# Patient Record
Sex: Male | Born: 1970 | Race: White | Hispanic: No | Marital: Married | Smoking: Current every day smoker
Health system: Southern US, Community
[De-identification: ages and names within clinical notes are randomized; demographics above are authoritative.]

## PROBLEM LIST (undated history)

## (undated) DIAGNOSIS — E7800 Pure hypercholesterolemia, unspecified: Secondary | ICD-10-CM

---

## 2018-07-17 ENCOUNTER — Other Ambulatory Visit: Payer: Self-pay | Admitting: Family Medicine

## 2018-07-17 DIAGNOSIS — R748 Abnormal levels of other serum enzymes: Secondary | ICD-10-CM

## 2018-07-25 ENCOUNTER — Other Ambulatory Visit: Payer: Self-pay

## 2018-07-26 ENCOUNTER — Ambulatory Visit
Admission: RE | Admit: 2018-07-26 | Discharge: 2018-07-26 | Disposition: A | Discharge status: Home / Self Care | Payer: 59 | Source: Ambulatory Visit | Attending: Family Medicine | Admitting: Family Medicine

## 2018-07-26 DIAGNOSIS — R748 Abnormal levels of other serum enzymes: Secondary | ICD-10-CM

## 2018-07-27 ENCOUNTER — Encounter (HOSPITAL_COMMUNITY): Payer: Self-pay | Admitting: Emergency Medicine

## 2018-07-27 ENCOUNTER — Emergency Department (HOSPITAL_COMMUNITY)
Admission: EM | Admit: 2018-07-27 | Discharge: 2018-07-27 | Disposition: A | Discharge status: Home / Self Care | Payer: 59 | Attending: Emergency Medicine | Admitting: Emergency Medicine

## 2018-07-27 ENCOUNTER — Other Ambulatory Visit: Payer: Self-pay

## 2018-07-27 DIAGNOSIS — Y9259 Other trade areas as the place of occurrence of the external cause: Secondary | ICD-10-CM | POA: Insufficient documentation

## 2018-07-27 DIAGNOSIS — Y998 Other external cause status: Secondary | ICD-10-CM | POA: Diagnosis not present

## 2018-07-27 DIAGNOSIS — S61215A Laceration without foreign body of left ring finger without damage to nail, initial encounter: Secondary | ICD-10-CM

## 2018-07-27 DIAGNOSIS — Y9379 Activity, other specified sports and athletics: Secondary | ICD-10-CM | POA: Insufficient documentation

## 2018-07-27 DIAGNOSIS — Z23 Encounter for immunization: Secondary | ICD-10-CM | POA: Diagnosis not present

## 2018-07-27 DIAGNOSIS — F17210 Nicotine dependence, cigarettes, uncomplicated: Secondary | ICD-10-CM | POA: Insufficient documentation

## 2018-07-27 DIAGNOSIS — W321XXA Accidental handgun malfunction, initial encounter: Secondary | ICD-10-CM | POA: Diagnosis not present

## 2018-07-27 HISTORY — DX: Pure hypercholesterolemia, unspecified: E78.00

## 2018-07-27 MED ORDER — TETANUS-DIPHTH-ACELL PERTUSSIS 5-2.5-18.5 LF-MCG/0.5 IM SUSP
0.5000 mL | Freq: Once | INTRAMUSCULAR | Status: AC
  Administered 2018-07-27: 0.5 mL via INTRAMUSCULAR
  Filled 2018-07-27: qty 0.5
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
## 2018-07-27 NOTE — Discharge Instructions (Addendum)
Your wound was repaired with Steri-Strips.  These will come off in about 7 days.  Please allow them to come off on their own.  Your tetanus status was updated today.  Please enter this information in your medical records.  Please see your primary physician or return to the emergency department if any signs of infection.
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
## 2021-01-14 ENCOUNTER — Other Ambulatory Visit: Payer: Self-pay | Admitting: Family Medicine

## 2021-01-14 ENCOUNTER — Other Ambulatory Visit: Payer: Self-pay

## 2021-01-14 ENCOUNTER — Ambulatory Visit
Admission: RE | Admit: 2021-01-14 | Discharge: 2021-01-14 | Disposition: A | Discharge status: Home / Self Care | Payer: 59 | Source: Ambulatory Visit | Attending: Family Medicine | Admitting: Family Medicine

## 2021-01-14 DIAGNOSIS — R1032 Left lower quadrant pain: Secondary | ICD-10-CM

## 2021-01-14 MED ORDER — IOPAMIDOL (ISOVUE-300) INJECTION 61%
100.0000 mL | Freq: Once | INTRAVENOUS | Status: AC | PRN
  Administered 2021-01-14: 100 mL via INTRAVENOUS

## 2021-09-27 ENCOUNTER — Ambulatory Visit: Admit: 2021-09-27 | Payer: 59 | Admitting: General Surgery

## 2021-09-27 SURGERY — REPAIR, HERNIA, INGUINAL, LAPAROSCOPIC
Anesthesia: General

## 2021-12-20 IMAGING — CT CT ABD-PELV W/ CM
2 of 5 series · 14 of 46 positions shown, 16 images · IV contrast (iopamidol)
Comparison: Ultrasound 07/26/2018

CLINICAL DATA: Left lower quadrant abdominal pain. Previous history
of diverticulitis.

EXAM:
CT ABDOMEN AND PELVIS WITH CONTRAST
TECHNIQUE: Multidetector CT imaging of the abdomen and pelvis was performed
using the standard protocol following bolus administration of
intravenous contrast.
CONTRAST:  100mL UB9FYE-SOO IOPAMIDOL (UB9FYE-SOO) INJECTION 61%

[Series 2: abd pelvis 5.00 br40 s3 axial · axial · 0.67mm/px · z∈[+1331,+1746]mm · 11 of 95 slices shown, 13 images]
[im 6/95  soft-tissue]
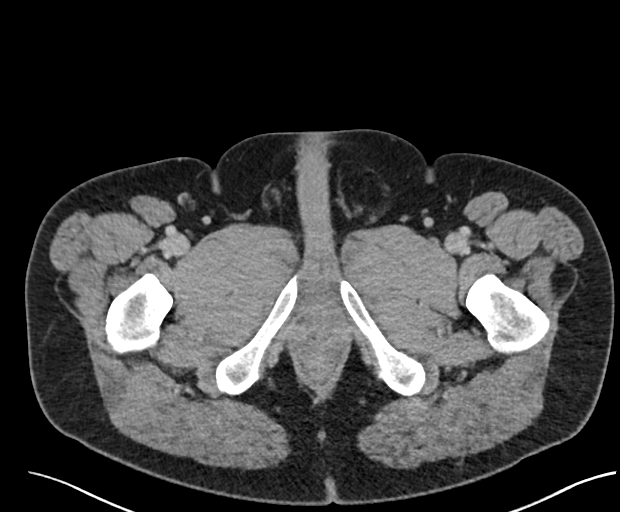
[im 6/95  bone]
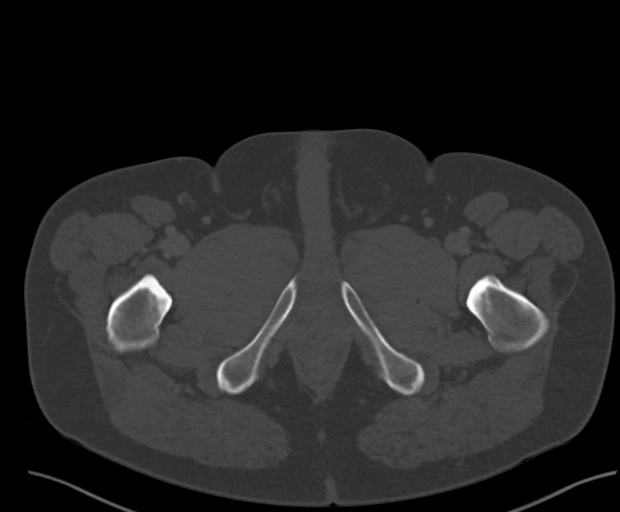
[im 16/95  soft-tissue]
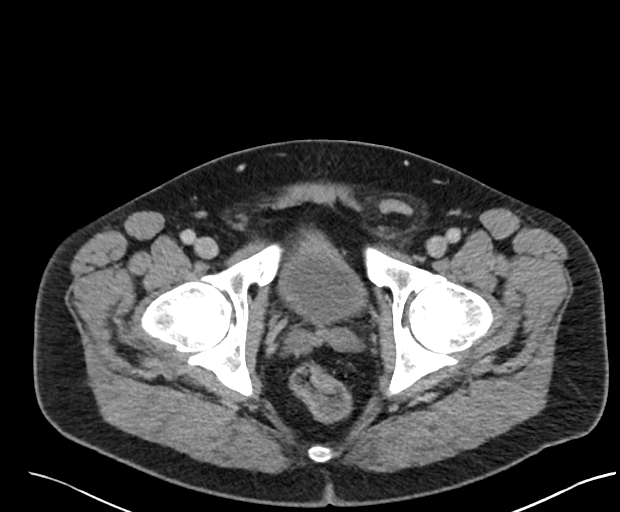
[im 21/95  soft-tissue]
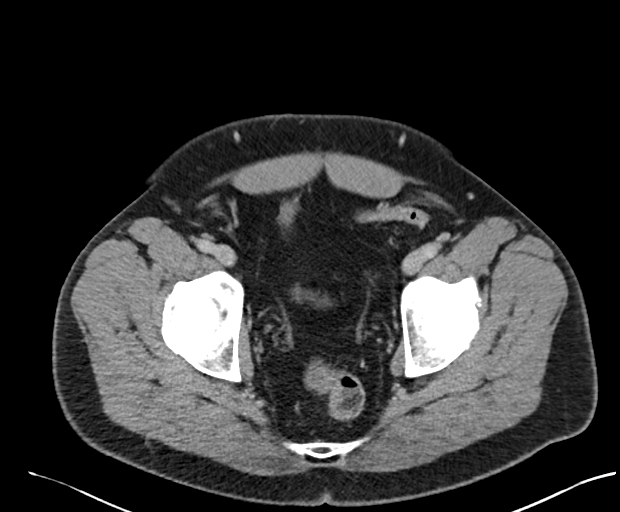
[im 32/95  soft-tissue]
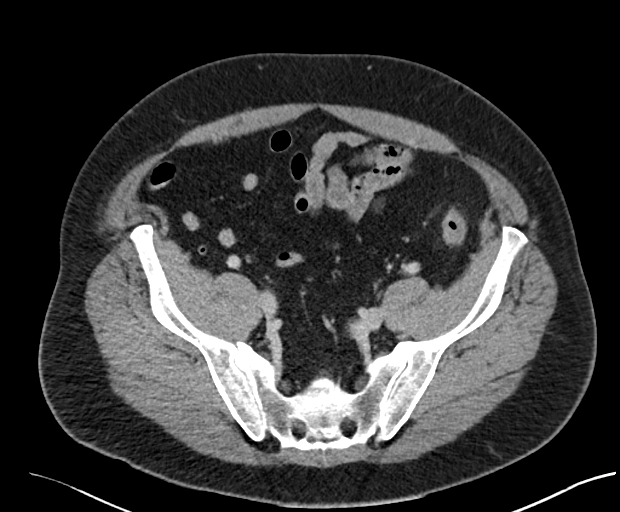
[im 37/95  soft-tissue]
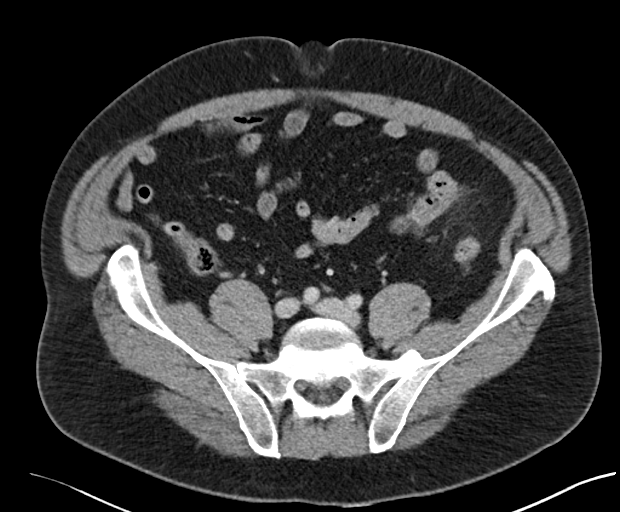
[im 48/95  soft-tissue]
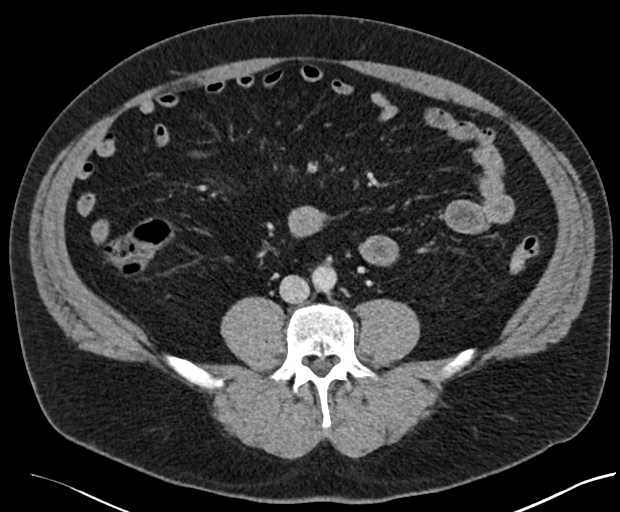
[im 58/95  soft-tissue]
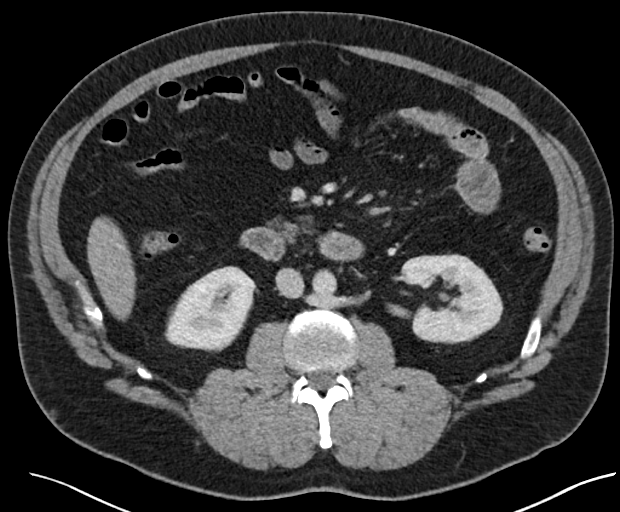
[im 63/95  soft-tissue]
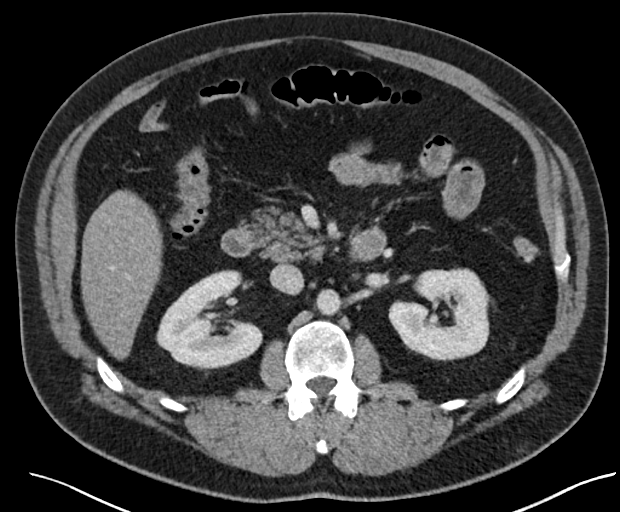
[im 74/95  soft-tissue]
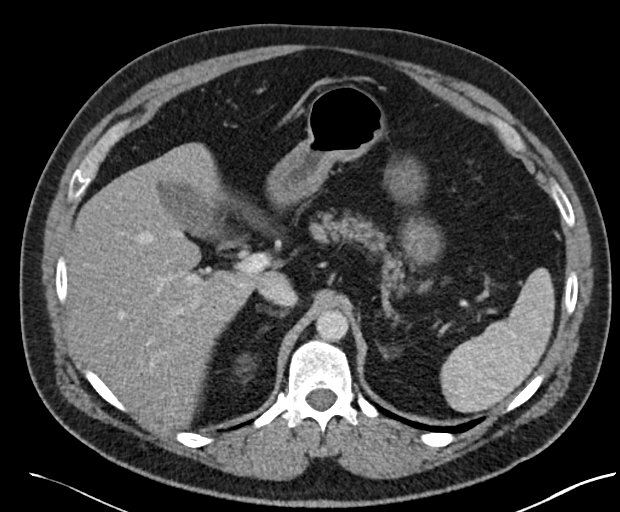
[im 74/95  bone]
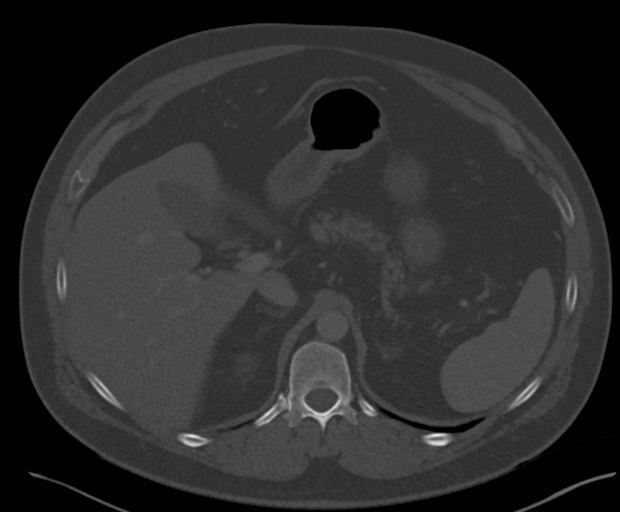
[im 79/95  soft-tissue]
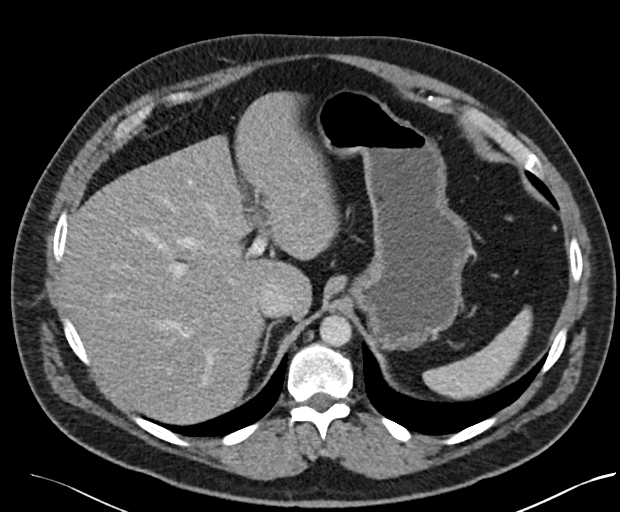
[im 89/95  soft-tissue]
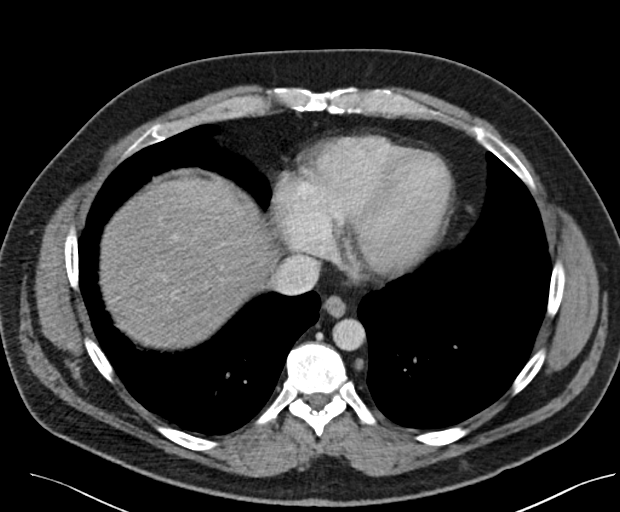

[Series 6: abd pelvis 2.00 br40 s3 cor · coronal · 0.82mm/px · 3 of 172 slices shown]
[im 58/172  soft-tissue]
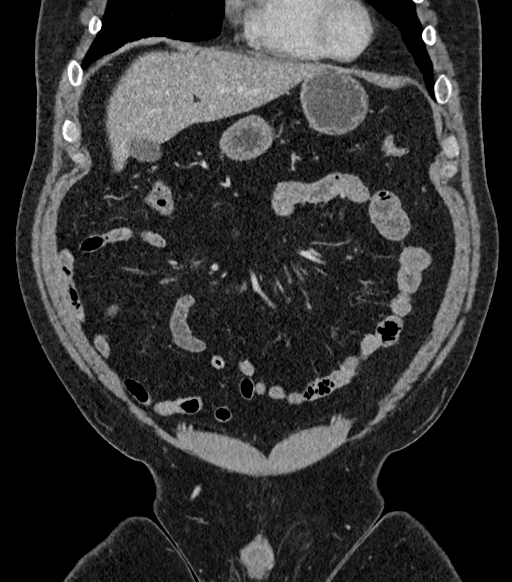
[im 77/172  soft-tissue]
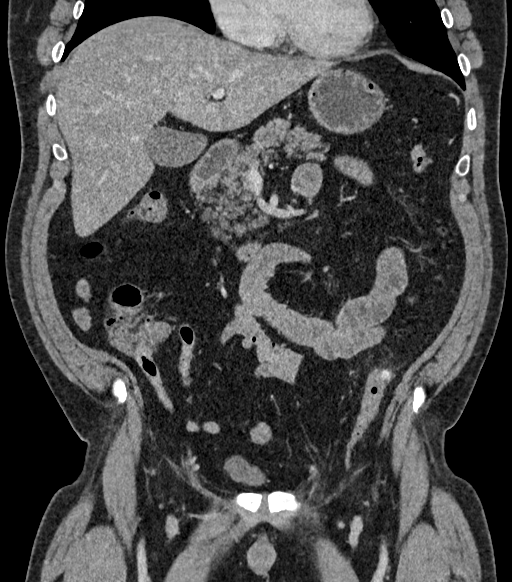
[im 96/172  soft-tissue]
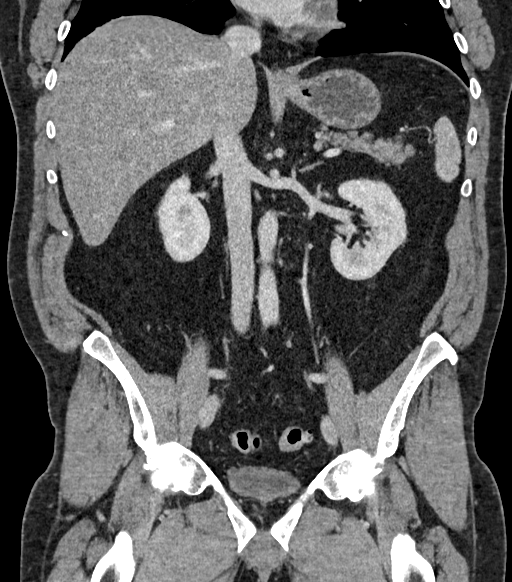

[14 of 46 positions shown; findings below may reference images not displayed]

FINDINGS: Lower chest: Normal

Hepatobiliary: Diffuse fatty change of the liver of a mild degree.
No focal lesion. No visible gallbladder or ductal disease.

Pancreas: Normal

Spleen: Normal

Adrenals/Urinary Tract: Adrenal glands are normal. Kidneys are
normal. Bladder is normal.

Stomach/Bowel: Stomach and small intestine are normal. Normal
appendix. Diverticulosis of the colon, mild-to-moderate. Acute
diverticulitis at the descending sigmoid junction with some edema in
the fat. No evidence of abscess, free fluid or free air.

Vascular/Lymphatic: Normal

Reproductive: Normal

Other: No free fluid or air. Inguinal hernia on the left containing
fat and a short segment of sigmoid colon. Small periumbilical hernia
containing only fat.

Musculoskeletal: Normal
IMPRESSION: 1. Acute diverticulitis at the descending sigmoid junction. No
evidence of abscess, free fluid or free air. Findings would be
considered mild by imaging.
2. Inguinal hernia on the left containing fat and a short segment of
sigmoid colon.
3. Small periumbilical hernia containing only fat.
4. Mild fatty change of the liver.

## 2022-02-27 ENCOUNTER — Encounter: Payer: Self-pay | Admitting: Podiatry

## 2022-02-27 ENCOUNTER — Ambulatory Visit (INDEPENDENT_AMBULATORY_CARE_PROVIDER_SITE_OTHER): Payer: BC Managed Care – PPO

## 2022-02-27 ENCOUNTER — Ambulatory Visit: Payer: BC Managed Care – PPO | Admitting: Podiatry

## 2022-02-27 DIAGNOSIS — M7662 Achilles tendinitis, left leg: Secondary | ICD-10-CM

## 2022-02-27 DIAGNOSIS — M722 Plantar fascial fibromatosis: Secondary | ICD-10-CM | POA: Diagnosis not present

## 2022-02-27 MED ORDER — TRIAMCINOLONE ACETONIDE 10 MG/ML IJ SUSP
10.0000 mg | Freq: Once | INTRAMUSCULAR | Status: AC
  Administered 2022-02-27: 10 mg

## 2022-02-27 NOTE — Patient Instructions (Signed)

## 2022-02-28 ENCOUNTER — Other Ambulatory Visit: Payer: Self-pay | Admitting: Podiatry

## 2022-02-28 ENCOUNTER — Telehealth: Payer: Self-pay | Admitting: Podiatry

## 2022-02-28 MED ORDER — DICLOFENAC SODIUM 75 MG PO TBEC: 75.0000 mg | DELAYED_RELEASE_TABLET | Freq: Two times a day (BID) | ORAL | 2 refills | Status: AC

## 2022-02-28 NOTE — Telephone Encounter (Signed)
Pt is still waiting for his medication. Pt was seen 02/27/22 ? ?Please Advise ?

## 2022-02-28 NOTE — Progress Notes (Signed)
Subjective:  ? ?Patient ID: Danny Maxwell, male   DOB: 51 y.o.   MRN: [id number]  ? ?HPI ?Patient presents with long-term history of pain in his heels and feet but states over the last 6 months the left heel has become increasingly sore increasingly hard to walk on.  Also gets some pain in his arch and his Achilles due to change in gait patient does smoke cigars on a daily basis and tries to be active ? ? ?Review of Systems  ?All other systems reviewed and are negative. ? ? ?   ?Objective:  ?Physical Exam ?Vitals and nursing note reviewed.  ?Constitutional:   ?   Appearance: He is well-developed.  ?Pulmonary:  ?   Effort: Pulmonary effort is normal.  ?Musculoskeletal:     ?   General: Normal range of motion.  ?Skin: ?   General: Skin is warm.  ?Neurological:  ?   Mental Status: He is alert.  ?  ?Neurovascular status intact muscle strength adequate range of motion within normal limits.  Patient is found to have severe discomfort medial fascial band left at the insertion tendon calcaneus mild discomfort posterior and mild arch pain also noted with moderate flatfoot deformity.  Patient has good digital perfusion well oriented x3 ? ?   ?Assessment:  ?Acute plantar fasciitis along with chronic foot structural issues with patient who works on cement floors extensive. ? ?   ?Plan:  ?H&P x-rays reviewed conditions discussed.  Prefers to get a focus on the acute inflammation and I did sterile prep and injected the plantar fascia 3 mg Kenalog 5 mg Xylocaine applied fascial brace gave instructions for physical therapy placed on oral anti-inflammatory and gave instructions for stretching exercises.  Reappoint to recheck 2 weeks discussed possible orthotics in future ? ?X-rays indicate that there is spur formation no indication stress fracture arthritis ?   ? ? ?

## 2022-02-28 NOTE — Telephone Encounter (Signed)
I sent in

## 2022-03-13 ENCOUNTER — Encounter: Payer: Self-pay | Admitting: Podiatry

## 2022-03-13 ENCOUNTER — Ambulatory Visit: Payer: BC Managed Care – PPO | Admitting: Podiatry

## 2022-03-13 DIAGNOSIS — M7662 Achilles tendinitis, left leg: Secondary | ICD-10-CM | POA: Diagnosis not present

## 2022-03-13 DIAGNOSIS — M722 Plantar fascial fibromatosis: Secondary | ICD-10-CM

## 2022-03-15 NOTE — Progress Notes (Signed)
Subjective:  ? ?Patient ID: Danny Maxwell, male   DOB: 51 y.o.   MRN: [id number]  ? ?HPI ?Patient states significantly improved with mild discomfort only after periods of activity ? ? ?ROS ? ? ?   ?Objective:  ?Physical Exam  ?Neurovascular status intact discomfort in the plantar heel that is improved with mild pain only upon deep palpation into the fascia ? ?   ?Assessment:  ?Acute fasciitis improving with mild area of discomfort ? ?   ?Plan:  ?H&P reviewed condition recommended continued physical therapy shoe gear modifications and support.  Patient's discharge will be seen back as needed ?   ? ? ?

## 2022-04-21 DIAGNOSIS — R7301 Impaired fasting glucose: Secondary | ICD-10-CM | POA: Diagnosis not present

## 2022-04-21 DIAGNOSIS — E782 Mixed hyperlipidemia: Secondary | ICD-10-CM | POA: Diagnosis not present

## 2022-04-21 DIAGNOSIS — M722 Plantar fascial fibromatosis: Secondary | ICD-10-CM | POA: Diagnosis not present

## 2022-04-21 DIAGNOSIS — K760 Fatty (change of) liver, not elsewhere classified: Secondary | ICD-10-CM | POA: Diagnosis not present

## 2022-05-22 ENCOUNTER — Ambulatory Visit: Payer: BC Managed Care – PPO | Admitting: Podiatry

## 2022-05-22 DIAGNOSIS — M722 Plantar fascial fibromatosis: Secondary | ICD-10-CM | POA: Diagnosis not present

## 2022-05-22 MED ORDER — TRIAMCINOLONE ACETONIDE 10 MG/ML IJ SUSP
10.0000 mg | Freq: Once | INTRAMUSCULAR | Status: AC
  Administered 2022-05-22: 10 mg

## 2022-05-22 NOTE — Progress Notes (Signed)
Subjective:   Patient ID: Danny Maxwell, male   DOB: 51 y.o.   MRN: [id number]   HPI Patient states that he was real active and developed pain again in the bottom of his left heel and he does work on cement floors   ROS      Objective:  Physical Exam  Neurovascular status intact exquisite discomfort plantar aspect left heel at the insertional point tendon calcaneus     Assessment:  Acute plantar fasciitis left present     Plan:  Reviewed condition and he did really inflamed this again I went ahead today did sterile prep injected the plantar fascia left 3 mg Kenalog 5 mg Xylocaine and at this point he is casted for functional orthotics to lift up the arch with all instructions given.  Patient will be seen back to recheck again in 4 to 6 weeks when orthotics are ready or earlier if needed

## 2022-06-14 ENCOUNTER — Ambulatory Visit (INDEPENDENT_AMBULATORY_CARE_PROVIDER_SITE_OTHER): Payer: BC Managed Care – PPO

## 2022-06-14 DIAGNOSIS — M722 Plantar fascial fibromatosis: Secondary | ICD-10-CM

## 2022-06-14 NOTE — Progress Notes (Signed)
Patient presents today to pick up custom molded foot orthotics, diagnosed with plantar fasciitis by Dr. Charlsie Merles.   Orthotics were dispensed and fit was satisfactory. Reviewed instructions for break-in and wear. Written instructions given to patient.  Patient will follow up as needed.   [facility] - order # [id number]

## 2022-07-10 ENCOUNTER — Other Ambulatory Visit: Payer: BC Managed Care – PPO

## 2022-07-14 ENCOUNTER — Encounter: Payer: Self-pay | Admitting: Podiatry

## 2022-07-14 ENCOUNTER — Ambulatory Visit: Payer: BC Managed Care – PPO | Admitting: Podiatry

## 2022-07-14 DIAGNOSIS — M722 Plantar fascial fibromatosis: Secondary | ICD-10-CM

## 2022-07-14 MED ORDER — TRIAMCINOLONE ACETONIDE 10 MG/ML IJ SUSP
10.0000 mg | Freq: Once | INTRAMUSCULAR | Status: AC
  Administered 2022-07-14: 10 mg

## 2022-07-17 NOTE — Progress Notes (Signed)
Subjective:   Patient ID: Danny Maxwell, male   DOB: 51 y.o.   MRN: [id number]   HPI Patient states he has had a reoccurrence of pain again in the left plantar heel and states its been very tender and makes it hard to wear shoe gear and to walk.  Worse when he get up in the morning after periods of sitting   ROS      Objective:  Physical Exam  Reoccurrence of acute plantar fasciitis left with inflammation with orthotics which are bothering him slightly in the forefoot     Assessment:  Acute fasciitis that reoccurred left with patient works on cement floors long hours     Plan:  Reviewed condition and at this point I have recommended 1 more cortisone injection with sterile prep and repeat injected this 3 mg Kenalog 5 mg Xylocaine and I then dispensed a night splint with all instructions on usage and had it fitted properly to his lower leg.  Reappoint to recheck and may change his orthotics depending on response

## 2022-08-11 ENCOUNTER — Ambulatory Visit: Payer: BC Managed Care – PPO | Admitting: Podiatry

## 2022-08-11 ENCOUNTER — Encounter: Payer: Self-pay | Admitting: Podiatry

## 2022-08-11 DIAGNOSIS — M722 Plantar fascial fibromatosis: Secondary | ICD-10-CM | POA: Diagnosis not present

## 2022-08-11 MED ORDER — TRIAMCINOLONE ACETONIDE 10 MG/ML IJ SUSP
10.0000 mg | Freq: Once | INTRAMUSCULAR | Status: AC
  Administered 2022-08-11: 10 mg

## 2022-08-13 NOTE — Progress Notes (Signed)
Subjective:   Patient ID: Danny Maxwell, male   DOB: 51 y.o.   MRN: [id number]   HPI Patient presents stating he still getting pain in his left heel but it seems to have moved to the outside versus the inside   ROS      Objective:  Physical Exam  Neurovascular status intact with improvement of fascial inflammation plantar medial aspect of the left heel with quite a bit of pain now in the plantar lateral aspect of the fascia     Assessment:  Hopefully we just have a change in the position of the inflammation to more lateral versus medial and that this is compensatory     Plan:  Explained condition and from the lateral side sterile prep injected the fascia 3 mg Kenalog central lateral band of the fascia instructed on physical therapy supportive therapy continue night splint usage reappoint if symptoms indicate

## 2022-08-25 DIAGNOSIS — E782 Mixed hyperlipidemia: Secondary | ICD-10-CM | POA: Diagnosis not present

## 2022-08-25 DIAGNOSIS — Z0000 Encounter for general adult medical examination without abnormal findings: Secondary | ICD-10-CM | POA: Diagnosis not present

## 2022-08-25 DIAGNOSIS — K760 Fatty (change of) liver, not elsewhere classified: Secondary | ICD-10-CM | POA: Diagnosis not present

## 2022-08-25 DIAGNOSIS — Z125 Encounter for screening for malignant neoplasm of prostate: Secondary | ICD-10-CM | POA: Diagnosis not present

## 2022-08-25 DIAGNOSIS — R7301 Impaired fasting glucose: Secondary | ICD-10-CM | POA: Diagnosis not present

## 2022-09-29 DIAGNOSIS — R748 Abnormal levels of other serum enzymes: Secondary | ICD-10-CM | POA: Diagnosis not present

## 2023-01-11 ENCOUNTER — Ambulatory Visit: Payer: BC Managed Care – PPO | Admitting: Podiatry

## 2023-01-11 ENCOUNTER — Encounter: Payer: Self-pay | Admitting: Podiatry

## 2023-01-11 DIAGNOSIS — M722 Plantar fascial fibromatosis: Secondary | ICD-10-CM

## 2023-01-12 NOTE — Progress Notes (Signed)
Subjective:   Patient ID: Danny Maxwell, male   DOB: 52 y.o.   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   ROS      Objective:  Physical Exam  Neurovascular status intact with exquisite discomfort left plantar fascia and into the central lateral band chronic in nature with no response of a significant nature to conservative care     Assessment:  Chronic Planter fasciitis left inflammation noted     Plan:  H&P reviewed condition recommended long-term shockwave and patient is scheduled for shockwave therapy after education rendered and I went ahead today and dispensed air fracture walker that I want him to use now to settle his symptoms down before this is done and then to use after the procedures are done with weekly procedures for 3 weeks followed by 1 month

## 2023-01-16 ENCOUNTER — Ambulatory Visit (INDEPENDENT_AMBULATORY_CARE_PROVIDER_SITE_OTHER): Payer: BC Managed Care – PPO

## 2023-01-16 DIAGNOSIS — M722 Plantar fascial fibromatosis: Secondary | ICD-10-CM

## 2023-01-16 NOTE — Patient Instructions (Signed)

## 2023-01-16 NOTE — Progress Notes (Signed)
Patient presents for the 1st EPAT treatment today with complaint of left heel pain. Diagnosed with plantar fasciitis by Dr. Paulla Dolly. This has been ongoing for several months. The patient has tried ice, stretching, NSAIDS and supportive shoe gear with no long term relief.   Most of the pain is located in the center of the left heel .  ESWT administered and tolerated well.Treatment settings initiated at:   Energy: 15  Ended treatment session today with 3000 shocks at the following settings:   Energy: 15  Frequency: 6.0  Joules: 14.72   Reviewed post EPAT instructions. Advised to avoid ice and NSAIDs throughout the treatment process and to utilize boot or supportive shoes for at least the next 3 days.  Follow up for 2nd treatment in 1 week.

## 2023-01-23 ENCOUNTER — Ambulatory Visit (INDEPENDENT_AMBULATORY_CARE_PROVIDER_SITE_OTHER): Payer: BC Managed Care – PPO

## 2023-01-23 DIAGNOSIS — M722 Plantar fascial fibromatosis: Secondary | ICD-10-CM

## 2023-01-23 NOTE — Progress Notes (Signed)
Patient presents for the 2nd EPAT treatment today with complaint of left heel pain. Diagnosed with plantar fasciitis by Dr. Paulla Dolly. This has been ongoing for several months. The patient has tried ice, stretching, NSAIDS and supportive shoe gear with no long term relief.   Most of the pain is located in the center of the left heel .  ESWT administered and tolerated well.Treatment settings initiated at:   Energy: 20  Ended treatment session today with 3000 shocks at the following settings:   Energy: 20  Frequency: 4.0  Joules: 19.62   Reviewed post EPAT instructions. Advised to avoid ice and NSAIDs throughout the treatment process and to utilize boot or supportive shoes for at least the next 3 days.  Follow up for 3rd treatment in 1 week.

## 2023-01-31 ENCOUNTER — Ambulatory Visit (INDEPENDENT_AMBULATORY_CARE_PROVIDER_SITE_OTHER): Payer: BC Managed Care – PPO | Admitting: *Deleted

## 2023-01-31 DIAGNOSIS — M722 Plantar fascial fibromatosis: Secondary | ICD-10-CM

## 2023-01-31 NOTE — Progress Notes (Addendum)
Patient presents for the 3rd EPAT treatment today with complaint of left heel pain. Diagnosed with plantar fasciitis by Dr. Paulla Dolly. This has been ongoing for several months. The patient has tried ice, stretching, NSAIDS and supportive shoe gear with no long term relief.   Most of the pain is located in the center of the left heel. Some days he feels better, but hasn't been consistent.  ESWT administered and tolerated well.Treatment settings initiated at:   Energy: 25  Ended treatment session today with 3000 shocks at the following settings:   Energy: 25  Frequency: 4.0  Joules: 24.52   Reviewed post EPAT instructions. Advised to avoid ice and NSAIDs throughout the treatment process and to utilize boot or supportive shoes for at least the next 3 days.  Follow up with Dr. Paulla Dolly in 2 weeks. He is going on a trip early May and would like to have an injection prior to.

## 2023-02-14 ENCOUNTER — Ambulatory Visit: Payer: BC Managed Care – PPO | Admitting: Podiatry

## 2023-02-14 ENCOUNTER — Encounter: Payer: Self-pay | Admitting: Podiatry

## 2023-02-14 DIAGNOSIS — M722 Plantar fascial fibromatosis: Secondary | ICD-10-CM

## 2023-02-14 MED ORDER — TRIAMCINOLONE ACETONIDE 10 MG/ML IJ SUSP
10.0000 mg | Freq: Once | INTRAMUSCULAR | Status: AC
  Administered 2023-02-14: 10 mg

## 2023-02-14 NOTE — Progress Notes (Signed)
Subjective:   Patient ID: Danny Maxwell, male   DOB: 52 y.o.   MRN: [id number]   HPI Patient presents stating seems to be improving I feel like I am getting pain more in the center and outside of the heel but I do think were starting to show up   ROS      Objective:  Physical Exam  Neurovascular status intact with discomfort in the center lateral but the shock which seems to be improving the condition     Assessment:  Satisfied so far that he is improving but he does have 1 area of discomfort and is getting ready to go on a trip     Plan:  H&P reviewed and I carefully injected just the lateral side 3 mg Dexasone Kenalog 5 mg Xylocaine and into the center of the tendon.  He will have 1 more shockwave 3 weeks and hopefully within the next 6 to 8 weeks resolution

## 2023-03-07 ENCOUNTER — Ambulatory Visit (INDEPENDENT_AMBULATORY_CARE_PROVIDER_SITE_OTHER): Payer: BC Managed Care – PPO | Admitting: Podiatry

## 2023-03-07 DIAGNOSIS — M722 Plantar fascial fibromatosis: Secondary | ICD-10-CM

## 2023-03-07 NOTE — Progress Notes (Unsigned)
Patient presents for the 3rd EPAT treatment today with complaint of left heel pain. Diagnosed with plantar fasciitis by Dr. Charlsie Merles. This has been ongoing for several months. The patient has tried ice, stretching, NSAIDS and supportive shoe gear with no long term relief.   Most of the pain is located in the center of the left heel. Some days he feels better, but hasn't been consistent.  ESWT administered and tolerated well.Treatment settings initiated at:   Energy: 25  Ended treatment session today with 3000 shocks at the following settings:   Energy: 25  Frequency: 4.0  Joules: 24.52   Reviewed post EPAT instructions. Advised to avoid ice and NSAIDs throughout the treatment process and to utilize boot or supportive shoes for at least the next 3 days.  Follow up with Dr. Charlsie Merles in 6 weeks if pain is no better.

## 2023-12-24 ENCOUNTER — Ambulatory Visit: Payer: 59 | Attending: Sports Medicine

## 2023-12-24 ENCOUNTER — Encounter: Payer: Self-pay | Admitting: Physical Therapy

## 2023-12-24 DIAGNOSIS — M6281 Muscle weakness (generalized): Secondary | ICD-10-CM | POA: Insufficient documentation

## 2023-12-24 DIAGNOSIS — M79621 Pain in right upper arm: Secondary | ICD-10-CM | POA: Insufficient documentation

## 2023-12-24 DIAGNOSIS — M25611 Stiffness of right shoulder, not elsewhere classified: Secondary | ICD-10-CM | POA: Insufficient documentation

## 2023-12-24 NOTE — Therapy (Signed)
 OUTPATIENT PHYSICAL THERAPY SHOULDER EVALUATION   Patient Name: Danny Maxwell MRN: [id number] DOB:02-Jan-1971, 53 y.o., male Today's Date: 12/24/2023  END OF SESSION:  PT End of Session - 12/24/23 0726     Visit Number 1    Number of Visits 17    Date for PT Re-Evaluation 02/18/24    PT Start Time 0730    PT Stop Time 0813    PT Time Calculation (min) 43 min    Activity Tolerance Patient tolerated treatment well    Behavior During Therapy [facility] for tasks assessed/performed             Past Medical History:  Diagnosis Date   High cholesterol    History reviewed. No pertinent surgical history. There are no active problems to display for this patient.   PCP: Lupita Raider, MD   REFERRING PROVIDER: Hurman Horn, MD   REFERRING DIAG: M75.101 (ICD-10-CM) - Rotator cuff syndrome of right shoulder  THERAPY DIAG:  Pain in right upper arm  Muscle weakness (generalized)  Stiffness of right shoulder, not elsewhere classified  Rationale for Evaluation and Treatment: Rehabilitation  ONSET DATE: 07/2023  SUBJECTIVE:                                                                                                                                                                                      SUBJECTIVE STATEMENT: Patient reports that he is undergoing an ultrasound guided injection at [facility]. Patient reports throbbing pain in his R shoulder. He believes it started in September 2024 but going to [employer] 09[place]2023 made it worse. Unable to sleep on R, unable lift without pain, difficulty reaching overhead and reaching behind back as well as across chest. 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   Patient wants to be ready by the time he goes to the [place] in mid April.   Hand dominance: Right  PERTINENT HISTORY: Per MD note on 12/12/23, "Right shoulder pain: partial rotator cuff tear, biceps tendinopathy, possible labral tear  Patient  presents with worsening right shoulder pain and decreased mobility since January. MRI shows mild mid to anterior supraspinatus tendinosis with 2mm x 2mm partial thickness tear, mild superior subscapularis tendinosis, partial tearing and swelling of proximal longhead biceps tendon, possible labral tear, moderate AC joint degenerative changes, and mild glenohumeral cartilage thinning. Pain exacerbated by reaching across and overhead movements, with radiation down arm. Morning stiffness improves with heat. Previous treatments (Tylenol, ibuprofen, shoulder injection) provided minimal relief. "   PAIN:  Are you having pain? Yes: NPRS scale: 1-2/10; worst 10/10 Pain location: anterior R shoulder at proximal biceps  insertion  Pain description: throbbing  Aggravating factors: reaching overhead, sleeping,  Relieving factors: ibuprofen, nothing usually helps    PRECAUTIONS: None  RED FLAGS: None   WEIGHT BEARING RESTRICTIONS: No  FALLS:  Has patient fallen in last 6 months? No  LIVING ENVIRONMENT: Lives with: lives with their family Lives in: House/apartment   OCCUPATION: Production manager for [employer]   PLOF: Independent  PATIENT GOALS: "to be able to use my arm" - maybe be able to play golf and to not have surgery  NEXT MD VISIT:   OBJECTIVE:  Note: Objective measures were completed at Evaluation unless otherwise noted.  DIAGNOSTIC FINDINGS:  MRI 10/2023:  -- mild-mod supraspinatus tendinosis with partial-thickness midsubstance tear measuring 2mm in transverse and 2mm in AP dimensions w/in anterior supraspinatus tendon footprint -- mild superior subscapularis tendinosis -- mild longitudinal linear intermediate T2 signal within proximal long head of biceps tendon, mild-partial thickness interstitial tear -- mod degen changes of AC joint -- mild GH cartilage thinning -- linear increased signal within posteriorsuperoir glenoid labrum suspicious for degenerative tear   PATIENT SURVEYS:   Quick Dash 63.6%  COGNITION: Overall cognitive status: Within functional limits for tasks assessed     SENSATION: WFL  POSTURE: Forward head, rounded shoulders   UPPER EXTREMITY ROM:   Active ROM Right eval Left eval  Shoulder flexion 108*   Shoulder extension    Shoulder abduction 80*   Shoulder adduction    Shoulder internal rotation 55   Shoulder external rotation 2*    (Blank rows = not tested)  UPPER EXTREMITY MMT:  MMT Right eval Left eval  Shoulder flexion 2+* 5  Shoulder extension    Shoulder abduction 2+* 5  Shoulder adduction  5  Shoulder internal rotation 2+* 5  Shoulder external rotation 2+* 5  Middle trapezius    Lower trapezius    Elbow flexion 3+* 5  Elbow extension 3+* 5  (Blank rows = not tested)  SHOULDER SPECIAL TESTS: Impingement tests: Hawkins/Kennedy impingement test: positive  Rotator cuff assessment: Empty can test: positive  and Full can test: negative Biceps assessment: Yergason's test: positive  and Speed's test: positive   JOINT MOBILITY TESTING:  No significant deficits noted   PALPATION:  TTP proximal insertion of biceps tendon                                                                                                                              TREATMENT DATE: 12/24/23   PATIENT EDUCATION: Education details: HEP, POC, goals  Person educated: Patient Education method: Explanation, Demonstration, and Handouts Education comprehension: verbalized understanding and returned demonstration  HOME EXERCISE PROGRAM: Access Code: [email or web address] URL: [email or web address] Date: 12/24/2023 Prepared by: Maylon Peppers  Exercises - Supine Shoulder Flexion with Dowel  - 2-3 x daily - 5-7 x weekly - 3 sets - 10 reps - Supine Shoulder Abduction AAROM with Dowel  - 2-3 x daily - 5-7 x weekly - 3 sets -  10 reps - Standing Isometric Shoulder Abduction with Doorway - Arm Bent  - 2-3 x daily - 5-7 x weekly - 3 sets - 10 reps  - 5 second hold - Standing Isometric Shoulder Flexion with Doorway - Arm Bent  - 2-3 x daily - 5-7 x weekly - 3 sets - 10 reps - 5 second  hold - Standing Isometric Shoulder External Rotation with Doorway  - 2-3 x daily - 5-7 x weekly - 3 sets - 10 reps - 5 second hold  ASSESSMENT:  CLINICAL IMPRESSION: Patient is a 53 y.o. male who was seen today for physical therapy evaluation and treatment for R shoulder pain with associated biceps tendinopathy and partial labral tear (per MRI). Patient demonstrates significant R shoulder ROM and strength deficits with associated pain. Testing indicative of R biceps tendinopathy, however testing also limited due to pain. Patient will benefit from skilled PT interventions to address listed impairments to improve quality of life and reduce pain.    OBJECTIVE IMPAIRMENTS: decreased activity tolerance, decreased endurance, decreased ROM, decreased strength, impaired flexibility, impaired sensation, impaired UE functional use, improper body mechanics, postural dysfunction, and pain.   ACTIVITY LIMITATIONS: carrying, lifting, bed mobility, bathing, toileting, and dressing  PARTICIPATION LIMITATIONS: cleaning, laundry, occupation, and yard work  PERSONAL FACTORS: Age, Behavior pattern, Fitness, and Time since onset of injury/illness/exacerbation are also affecting patient's functional outcome.   REHAB POTENTIAL: Good  CLINICAL DECISION MAKING: Evolving/moderate complexity  EVALUATION COMPLEXITY: Moderate   GOALS: Goals reviewed with patient? Yes  SHORT TERM GOALS: Target date: 01/21/2024  Patient will be independent in HEP to improve strength/mobility for better functional independence with ADLs. Baseline: 2/24: HEP initiated  Goal status: INITIAL   LONG TERM GOALS: Target date: 02/18/2024   Patient will decrease Quick DASH score <40 points demonstrating reduced self-reported upper extremity disability. Baseline: 2/24: 63.6% Goal status: INITIAL  2.   Patient will improve AROM of R shoulder to symmetrical to L UE for improved ability to perform ADLs and iADLs.  Baseline: 2/24: see above  Goal status: INITIAL  3.  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   Baseline: 2/24: see above Goal status: INITIAL  4.  Patient will report <3/10 pain at worst on NRPS in R shoulder with activity to demonstrate improved tolerance and ability to complete ADLs and iADLs.  Baseline: 2/24: 10/10 Goal status: INITIAL   PLAN:  PT FREQUENCY: 1-2x/week  PT DURATION: 8 weeks  PLANNED INTERVENTIONS: 97164- PT Re-evaluation, 97110-Therapeutic exercises, 97530- Therapeutic activity, 97112- Neuromuscular re-education, 97535- Self Care, [id number]- Manual therapy, [id number]- Electrical stimulation (unattended), [phone]- Electrical stimulation (manual), Patient/Family education, Taping, Dry Needling, Joint mobilization, Joint manipulation, Cryotherapy, and Moist heat  PLAN FOR NEXT SESSION: HEP review, PROM/AAROM R shoulder, isometrics   Maylon Peppers, PT, DPT Physical Therapist - [place]  [facility]  12/24/2023, 12:27 PM

## 2023-12-25 NOTE — Therapy (Signed)
 OUTPATIENT PHYSICAL THERAPY SHOULDER TREATMENT   Patient Name: Danny Maxwell MRN: [id number] DOB:02-18-1971, 53 y.o., male Today's Date: 12/26/2023  END OF SESSION:  PT End of Session - 12/26/23 0727     Visit Number 2    Number of Visits 17    Date for PT Re-Evaluation 02/18/24    PT Start Time 0730    PT Stop Time 0812    PT Time Calculation (min) 42 min    Activity Tolerance Patient tolerated treatment well    Behavior During Therapy [facility] for tasks assessed/performed              Past Medical History:  Diagnosis Date   High cholesterol    History reviewed. No pertinent surgical history. There are no active problems to display for this patient.   PCP: Lupita Raider, MD   REFERRING PROVIDER: Hurman Horn, MD   REFERRING DIAG: M75.101 (ICD-10-CM) - Rotator cuff syndrome of right shoulder  THERAPY DIAG:  Pain in right upper arm  Muscle weakness (generalized)  Stiffness of right shoulder, not elsewhere classified  Rationale for Evaluation and Treatment: Rehabilitation  ONSET DATE: 07/2023  SUBJECTIVE:                                                                                                                                                                                      SUBJECTIVE STATEMENT: Patient reports that he is undergoing an ultrasound guided injection at [facility]. Patient reports throbbing pain in his R shoulder. He believes it started in September 2024 but going to [employer] 09[place]2023 made it worse. Unable to sleep on R, unable lift without pain, difficulty reaching overhead and reaching behind back as well as across chest. 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   Patient wants to be ready by the time he goes to the [place] in mid April.   Hand dominance: Right  PERTINENT HISTORY: Per MD note on 12/12/23, "Right shoulder pain: partial rotator cuff tear, biceps tendinopathy, possible labral tear  Patient  presents with worsening right shoulder pain and decreased mobility since January. MRI shows mild mid to anterior supraspinatus tendinosis with 2mm x 2mm partial thickness tear, mild superior subscapularis tendinosis, partial tearing and swelling of proximal longhead biceps tendon, possible labral tear, moderate AC joint degenerative changes, and mild glenohumeral cartilage thinning. Pain exacerbated by reaching across and overhead movements, with radiation down arm. Morning stiffness improves with heat. Previous treatments (Tylenol, ibuprofen, shoulder injection) provided minimal relief. "   PAIN:  Are you having pain? Yes: NPRS scale: 1-2/10; worst 10/10 Pain location: anterior R shoulder at proximal  biceps insertion  Pain description: throbbing  Aggravating factors: reaching overhead, sleeping,  Relieving factors: ibuprofen, nothing usually helps    PRECAUTIONS: None  RED FLAGS: None   WEIGHT BEARING RESTRICTIONS: No  FALLS:  Has patient fallen in last 6 months? No  LIVING ENVIRONMENT: Lives with: lives with their family Lives in: House/apartment   OCCUPATION: Production manager for [employer]   PLOF: Independent  PATIENT GOALS: "to be able to use my arm" - maybe be able to play golf and to not have surgery  NEXT MD VISIT:   OBJECTIVE:  Note: Objective measures were completed at Evaluation unless otherwise noted.  DIAGNOSTIC FINDINGS:  MRI 10/2023:  -- mild-mod supraspinatus tendinosis with partial-thickness midsubstance tear measuring 2mm in transverse and 2mm in AP dimensions w/in anterior supraspinatus tendon footprint -- mild superior subscapularis tendinosis -- mild longitudinal linear intermediate T2 signal within proximal long head of biceps tendon, mild-partial thickness interstitial tear -- mod degen changes of AC joint -- mild GH cartilage thinning -- linear increased signal within posteriorsuperoir glenoid labrum suspicious for degenerative tear   PATIENT SURVEYS:   Quick Dash 63.6%  COGNITION: Overall cognitive status: Within functional limits for tasks assessed     SENSATION: WFL  POSTURE: Forward head, rounded shoulders   UPPER EXTREMITY ROM:   Active ROM Right eval Left eval  Shoulder flexion 108*   Shoulder extension    Shoulder abduction 80*   Shoulder adduction    Shoulder internal rotation 55   Shoulder external rotation 2*    (Blank rows = not tested)  UPPER EXTREMITY MMT:  MMT Right eval Left eval  Shoulder flexion 2+* 5  Shoulder extension    Shoulder abduction 2+* 5  Shoulder adduction  5  Shoulder internal rotation 2+* 5  Shoulder external rotation 2+* 5  Middle trapezius    Lower trapezius    Elbow flexion 3+* 5  Elbow extension 3+* 5  (Blank rows = not tested)  SHOULDER SPECIAL TESTS: Impingement tests: Hawkins/Kennedy impingement test: positive  Rotator cuff assessment: Empty can test: positive  and Full can test: negative Biceps assessment: Yergason's test: positive  and Speed's test: positive   JOINT MOBILITY TESTING:  No significant deficits noted   PALPATION:  TTP proximal insertion of biceps tendon                                                                                                                              TREATMENT DATE: 12/26/23   Subjective: Patient received cortisone injections in his R biceps tendon and GHJ. Per MD, no strength training on 12/26/23 but is cleared after this date to initiate strengthening.   PROM R shoulder flexion, abduction, and ER x 20 minutes  STM and trigger point release to R pectoralis major and minor x 5 minutes After PROM: R shoulder AROM:   Flexion: 126  Abduction: 83  ER: 30  R shoulder PROM  Flexion: 130  Abduction: 87  ER: 35  AAROM R shoulder flexion with dowel x 20    - unable to tolerate AAROM abduction with dowel this date due to limited ER  AAROM R shoulder ER with dowel with towel propped under upper arm x 20  Wall ladder R  shoulder flexion and abduction x 1 minute each  Pulleys in shoulder flexion and abduction x 1 minute each   PATIENT EDUCATION: Education details: HEP, POC, goals  Person educated: Patient Education method: Explanation, Demonstration, and Handouts Education comprehension: verbalized understanding and returned demonstration  HOME EXERCISE PROGRAM: Access Code: [email or web address] URL: [email or web address] Date: 12/24/2023 Prepared by: Maylon Peppers  Exercises - Supine Shoulder Flexion with Dowel  - 2-3 x daily - 5-7 x weekly - 3 sets - 10 reps - Supine Shoulder Abduction AAROM with Dowel  - 2-3 x daily - 5-7 x weekly - 3 sets - 10 reps - Standing Isometric Shoulder Abduction with Doorway - Arm Bent  - 2-3 x daily - 5-7 x weekly - 3 sets - 10 reps - 5 second hold - Standing Isometric Shoulder Flexion with Doorway - Arm Bent  - 2-3 x daily - 5-7 x weekly - 3 sets - 10 reps - 5 second  hold - Standing Isometric Shoulder External Rotation with Doorway  - 2-3 x daily - 5-7 x weekly - 3 sets - 10 reps - 5 second hold  ASSESSMENT:  CLINICAL IMPRESSION:   Patient received cortisone injection in R biceps tendon and GH joint on 12/25/23 with MD requesting to avoid strength training on this date but cleared for future sessions. Session focused on improving R shoulder ROM passively, active assisted, and actively. Notable pec tightness on R due to positioning of shoulder for protection 2/2 pain. STM and trigger point release to R pec allowed for slight improvement in ROM primarily in ER. Patient will benefit from skilled PT interventions to address listed impairments to improve quality of life and reduce pain.     OBJECTIVE IMPAIRMENTS: decreased activity tolerance, decreased endurance, decreased ROM, decreased strength, impaired flexibility, impaired sensation, impaired UE functional use, improper body mechanics, postural dysfunction, and pain.   ACTIVITY LIMITATIONS: carrying, lifting, bed  mobility, bathing, toileting, and dressing  PARTICIPATION LIMITATIONS: cleaning, laundry, occupation, and yard work  PERSONAL FACTORS: Age, Behavior pattern, Fitness, and Time since onset of injury/illness/exacerbation are also affecting patient's functional outcome.   REHAB POTENTIAL: Good  CLINICAL DECISION MAKING: Evolving/moderate complexity  EVALUATION COMPLEXITY: Moderate   GOALS: Goals reviewed with patient? Yes  SHORT TERM GOALS: Target date: 01/21/2024  Patient will be independent in HEP to improve strength/mobility for better functional independence with ADLs. Baseline: 2/24: HEP initiated  Goal status: INITIAL   LONG TERM GOALS: Target date: 02/18/2024   Patient will decrease Quick DASH score <40 points demonstrating reduced self-reported upper extremity disability. Baseline: 2/24: 63.6% Goal status: INITIAL  2.  Patient will improve AROM of R shoulder to symmetrical to L UE for improved ability to perform ADLs and iADLs.  Baseline: 2/24: see above  Goal status: INITIAL  3.  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   Baseline: 2/24: see above Goal status: INITIAL  4.  Patient will report <3/10 pain at worst on NRPS in R shoulder with activity to demonstrate improved tolerance and ability to complete ADLs and iADLs.  Baseline: 2/24: 10/10 Goal status: INITIAL   PLAN:  PT FREQUENCY: 1-2x/week  PT DURATION: 8 weeks  PLANNED INTERVENTIONS: 97164- PT Re-evaluation, 97110-Therapeutic exercises, 97530- Therapeutic activity, 97112- Neuromuscular re-education, 97535- Self Care, 16109- Manual therapy, [id number]- Electrical stimulation (unattended), [phone]- Electrical stimulation (manual), Patient/Family education, Taping, Dry Needling, Joint mobilization, Joint manipulation, Cryotherapy, and Moist heat  PLAN FOR NEXT SESSION: PROM/AAROM R shoulder, isometrics, dry needling as warranted to pec    Maylon Peppers, PT, DPT Physical Therapist - [facility]  [facility]  12/26/2023, 7:28 AM

## 2023-12-26 ENCOUNTER — Encounter: Payer: Self-pay | Admitting: Physical Therapy

## 2023-12-26 ENCOUNTER — Ambulatory Visit: Payer: 59

## 2023-12-26 DIAGNOSIS — M25611 Stiffness of right shoulder, not elsewhere classified: Secondary | ICD-10-CM

## 2023-12-26 DIAGNOSIS — M6281 Muscle weakness (generalized): Secondary | ICD-10-CM

## 2023-12-26 DIAGNOSIS — M79621 Pain in right upper arm: Secondary | ICD-10-CM | POA: Diagnosis not present

## 2023-12-30 NOTE — Therapy (Unsigned)
 OUTPATIENT PHYSICAL THERAPY SHOULDER TREATMENT   Patient Name: Virl Coble MRN: [id number] DOB:[birth year], 53 y.o., male Today's Date: 12/31/2023  END OF SESSION:  PT End of Session - 12/31/23 0728     Visit Number 3    Number of Visits 17    Date for PT Re-Evaluation 02/18/24    PT Start Time 0729    PT Stop Time 0811    PT Time Calculation (min) 42 min    Activity Tolerance Patient tolerated treatment well    Behavior During Therapy [facility] for tasks assessed/performed               Past Medical History:  Diagnosis Date   High cholesterol    History reviewed. No pertinent surgical history. There are no active problems to display for this patient.   PCP: Lupita Raider, MD   REFERRING PROVIDER: Hurman Horn, MD   REFERRING DIAG: M75.101 (ICD-10-CM) - Rotator cuff syndrome of right shoulder  THERAPY DIAG:  Pain in right upper arm  Muscle weakness (generalized)  Stiffness of right shoulder, not elsewhere classified  Rationale for Evaluation and Treatment: Rehabilitation  ONSET DATE: 07/2023  SUBJECTIVE:                                                                                                                                                                                      SUBJECTIVE STATEMENT: Patient reports that he is undergoing an ultrasound guided injection at [facility]. Patient reports throbbing pain in his R shoulder. He believes it started in September 2024 but going to [employer] 09/2023 made it worse. Unable to sleep on R, unable lift without pain, difficulty reaching overhead and reaching behind back as well as across chest. 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   Patient wants to be ready by the time he goes to the [place] in mid April.   Hand dominance: Right  PERTINENT HISTORY: Per MD note on 12/12/23, "Right shoulder pain: partial rotator cuff tear, biceps tendinopathy, possible labral tear  Patient  presents with worsening right shoulder pain and decreased mobility since January. MRI shows mild mid to anterior supraspinatus tendinosis with 2mm x 2mm partial thickness tear, mild superior subscapularis tendinosis, partial tearing and swelling of proximal longhead biceps tendon, possible labral tear, moderate AC joint degenerative changes, and mild glenohumeral cartilage thinning. Pain exacerbated by reaching across and overhead movements, with radiation down arm. Morning stiffness improves with heat. Previous treatments (Tylenol, ibuprofen, shoulder injection) provided minimal relief. "   PAIN:  Are you having pain? Yes: NPRS scale: 1-2/10; worst 10/10 Pain location: anterior R shoulder at  proximal biceps insertion  Pain description: throbbing  Aggravating factors: reaching overhead, sleeping,  Relieving factors: ibuprofen, nothing usually helps    PRECAUTIONS: None  RED FLAGS: None   WEIGHT BEARING RESTRICTIONS: No  FALLS:  Has patient fallen in last 6 months? No  LIVING ENVIRONMENT: Lives with: lives with their family Lives in: House/apartment   OCCUPATION: Production manager for [employer]   PLOF: Independent  PATIENT GOALS: "to be able to use my arm" - maybe be able to play golf and to not have surgery   NEXT MD VISIT:    OBJECTIVE:  Note: Objective measures were completed at Evaluation unless otherwise noted.  DIAGNOSTIC FINDINGS:  MRI 10/2023:  -- mild-mod supraspinatus tendinosis with partial-thickness midsubstance tear measuring 2mm in transverse and 2mm in AP dimensions w/in anterior supraspinatus tendon footprint -- mild superior subscapularis tendinosis -- mild longitudinal linear intermediate T2 signal within proximal long head of biceps tendon, mild-partial thickness interstitial tear -- mod degen changes of AC joint -- mild GH cartilage thinning -- linear increased signal within posteriorsuperoir glenoid labrum suspicious for degenerative tear   PATIENT SURVEYS:   Quick Dash 63.6%  COGNITION: Overall cognitive status: Within functional limits for tasks assessed     SENSATION: WFL  POSTURE: Forward head, rounded shoulders   UPPER EXTREMITY ROM:   Active ROM Right eval Left eval  Shoulder flexion 108*   Shoulder extension    Shoulder abduction 80*   Shoulder adduction    Shoulder internal rotation 55   Shoulder external rotation 2*    (Blank rows = not tested)  UPPER EXTREMITY MMT:  MMT Right eval Left eval  Shoulder flexion 2+* 5  Shoulder extension    Shoulder abduction 2+* 5  Shoulder adduction  5  Shoulder internal rotation 2+* 5  Shoulder external rotation 2+* 5  Middle trapezius    Lower trapezius    Elbow flexion 3+* 5  Elbow extension 3+* 5  (Blank rows = not tested)  SHOULDER SPECIAL TESTS: Impingement tests: Hawkins/Kennedy impingement test: positive  Rotator cuff assessment: Empty can test: positive  and Full can test: negative Biceps assessment: Yergason's test: positive  and Speed's test: positive   JOINT MOBILITY TESTING:  No significant deficits noted   PALPATION:  TTP proximal insertion of biceps tendon                                                                                                                              TREATMENT DATE: 12/31/23   Subjective: Patient had cortisone injections last Tuesday - still painful but feels  better. Pt was told that he would hold on weights for remainder of week last week. Patient reports R anterior shoulder is aching; pt repots difficulty with abduction > 90 deg. Pt reports good response to STM last visit. 2-3/10 pain at arrival.    Manual Therapy - for symptom modulation, soft tissue sensitivity and mobility, joint mobility, ROM  PROM R shoulder  flexion, abduction, and ER x 15 minutes  GHJ mobilization: inferior glide gr III for 2 x 30 sec, posterior glide gr II for  2 x 30 sec DTM and pin-and-stretch to R pectoralis major and minor x 5 minutes  After  PROM/manual therapy: R shoulder AROM:   Flexion: 121  Abduction: 83  ER: 30     Therapeutic Exercise - for improved soft tissue flexibility and extensibility as needed for ROM, shoulder complex progressive motion   AAROM R shoulder flexion with dowel x 20    -138 deg obtained AAROM R shoulder ER with dowel with towel propped under upper arm x 10  -AAROM to 10 deg only Sidelying abduction on L side; 1x15 Pulleys in shoulder flexion and abduction x 1 minute each   PATIENT EDUCATION: Provided pulleys for at-home use and discussed setup at home with modifying length of pulley prn. Discussed ranging shoulder within limits of pain, with mild pain expected c accessing new ROM.    *not today* Wall ladder R shoulder flexion and abduction x 1 minute each      PATIENT EDUCATION: Education details: HEP, POC, goals  Person educated: Patient Education method: Explanation, Demonstration, and Handouts Education comprehension: verbalized understanding and returned demonstration  HOME EXERCISE PROGRAM: Access Code: [email or web address] URL: [email or web address] Date: 12/24/2023 Prepared by: Maylon Peppers  Exercises - Supine Shoulder Flexion with Dowel  - 2-3 x daily - 5-7 x weekly - 3 sets - 10 reps - Supine Shoulder Abduction AAROM with Dowel  - 2-3 x daily - 5-7 x weekly - 3 sets - 10 reps - Standing Isometric Shoulder Abduction with Doorway - Arm Bent  - 2-3 x daily - 5-7 x weekly - 3 sets - 10 reps - 5 second hold - Standing Isometric Shoulder Flexion with Doorway - Arm Bent  - 2-3 x daily - 5-7 x weekly - 3 sets - 10 reps - 5 second  hold - Standing Isometric Shoulder External Rotation with Doorway  - 2-3 x daily - 5-7 x weekly - 3 sets - 10 reps - 5 second hold  ASSESSMENT:  CLINICAL IMPRESSION:   Progress with ROM is slow given notable irritability of bicipital groove region. Pt is most limited into ER and ABD; pt will need to continue work on ROM as tolerated to prevent  frozen shoulder. Patient has modestly improved pain after cortisone injection, but he does still have notable discomfort along biceps tendon region. Pt is able to access more forward elevation AAROM; pt was provided pulleys to allow for further AAROM as tolerated at home. Patient will benefit from skilled PT interventions to address listed impairments to improve quality of life and reduce pain.     OBJECTIVE IMPAIRMENTS: decreased activity tolerance, decreased endurance, decreased ROM, decreased strength, impaired flexibility, impaired sensation, impaired UE functional use, improper body mechanics, postural dysfunction, and pain.   ACTIVITY LIMITATIONS: carrying, lifting, bed mobility, bathing, toileting, and dressing  PARTICIPATION LIMITATIONS: cleaning, laundry, occupation, and yard work  PERSONAL FACTORS: Age, Behavior pattern, Fitness, and Time since onset of injury/illness/exacerbation are also affecting patient's functional outcome.   REHAB POTENTIAL: Good  CLINICAL DECISION MAKING: Evolving/moderate complexity  EVALUATION COMPLEXITY: Moderate   GOALS: Goals reviewed with patient? Yes  SHORT TERM GOALS: Target date: 01/21/2024  Patient will be independent in HEP to improve strength/mobility for better functional independence with ADLs. Baseline: 2/24: HEP initiated  Goal status: INITIAL   LONG TERM GOALS: Target date: 02/18/2024   Patient will decrease Quick DASH  score <40 points demonstrating reduced self-reported upper extremity disability. Baseline: 2/24: 63.6% Goal status: INITIAL  2.  Patient will improve AROM of R shoulder to symmetrical to L UE for improved ability to perform ADLs and iADLs.  Baseline: 2/24: see above  Goal status: INITIAL  3.  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   Baseline: 2/24: see above Goal status: INITIAL  4.  Patient will report <3/10 pain at worst on NRPS in  R shoulder with activity to demonstrate improved tolerance and ability to complete ADLs and iADLs.  Baseline: 2/24: 10/10 Goal status: INITIAL   PLAN:  PT FREQUENCY: 1-2x/week  PT DURATION: 8 weeks  PLANNED INTERVENTIONS: 97164- PT Re-evaluation, 97110-Therapeutic exercises, 97530- Therapeutic activity, 97112- Neuromuscular re-education, 97535- Self Care, 16109- Manual therapy, [id number]- Electrical stimulation (unattended), [phone]- Electrical stimulation (manual), Patient/Family education, Taping, Dry Needling, Joint mobilization, Joint manipulation, Cryotherapy, and Moist heat  PLAN FOR NEXT SESSION: PROM/AAROM R shoulder, isometrics, dry needling as warranted to pec   Consuela Mimes, PT, DPT [phone] Physical Therapist - [facility]  12/31/2023, 8:18 AM

## 2023-12-31 ENCOUNTER — Ambulatory Visit: Payer: Self-pay | Attending: Sports Medicine | Admitting: Physical Therapy

## 2023-12-31 ENCOUNTER — Encounter: Payer: Self-pay | Admitting: Physical Therapy

## 2023-12-31 DIAGNOSIS — M6281 Muscle weakness (generalized): Secondary | ICD-10-CM | POA: Insufficient documentation

## 2023-12-31 DIAGNOSIS — M25611 Stiffness of right shoulder, not elsewhere classified: Secondary | ICD-10-CM | POA: Diagnosis present

## 2023-12-31 DIAGNOSIS — M79621 Pain in right upper arm: Secondary | ICD-10-CM | POA: Diagnosis present

## 2024-01-02 ENCOUNTER — Ambulatory Visit: Payer: 59 | Admitting: Physical Therapy

## 2024-01-02 DIAGNOSIS — M79621 Pain in right upper arm: Secondary | ICD-10-CM | POA: Diagnosis not present

## 2024-01-02 DIAGNOSIS — M6281 Muscle weakness (generalized): Secondary | ICD-10-CM

## 2024-01-02 DIAGNOSIS — M25611 Stiffness of right shoulder, not elsewhere classified: Secondary | ICD-10-CM

## 2024-01-02 NOTE — Therapy (Signed)
 OUTPATIENT PHYSICAL THERAPY SHOULDER TREATMENT   Patient Name: Danny Maxwell MRN: [id number] DOB:1971-08-15, 53 y.o., male Today's Date: 01/02/2024  END OF SESSION:  PT End of Session - 01/02/24 0801     Visit Number 4    Number of Visits 17    Date for PT Re-Evaluation 02/18/24    PT Start Time 0730    PT Stop Time 0813    PT Time Calculation (min) 43 min    Activity Tolerance Patient tolerated treatment well    Behavior During Therapy [facility] for tasks assessed/performed                Past Medical History:  Diagnosis Date   High cholesterol    No past surgical history on file. There are no active problems to display for this patient.   PCP: Lupita Raider, MD   REFERRING PROVIDER: Hurman Horn, MD   REFERRING DIAG: M75.101 (ICD-10-CM) - Rotator cuff syndrome of right shoulder  THERAPY DIAG:  Pain in right upper arm  Muscle weakness (generalized)  Stiffness of right shoulder, not elsewhere classified  Rationale for Evaluation and Treatment: Rehabilitation  ONSET DATE: 07/2023  SUBJECTIVE:                                                                                                                                                                                      SUBJECTIVE STATEMENT: Patient reports that he is undergoing an ultrasound guided injection at [facility]. Patient reports throbbing pain in his R shoulder. He believes it started in September 2024 but going to [employer] 09[place]23 made it worse. Unable to sleep on R, unable lift without pain, difficulty reaching overhead and reaching behind back as well as across chest. 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   Patient wants to be ready by the time he goes to the [place] in mid April.   Hand dominance: Right  PERTINENT HISTORY: Per MD note on 12/12/23, "Right shoulder pain: partial rotator cuff tear, biceps tendinopathy, possible labral tear  Patient presents with  worsening right shoulder pain and decreased mobility since January. MRI shows mild mid to anterior supraspinatus tendinosis with 2mm x 2mm partial thickness tear, mild superior subscapularis tendinosis, partial tearing and swelling of proximal longhead biceps tendon, possible labral tear, moderate AC joint degenerative changes, and mild glenohumeral cartilage thinning. Pain exacerbated by reaching across and overhead movements, with radiation down arm. Morning stiffness improves with heat. Previous treatments (Tylenol, ibuprofen, shoulder injection) provided minimal relief. "   PAIN:  Are you having pain? Yes: NPRS scale: 1-2/10; worst 10/10 Pain location: anterior R shoulder  at proximal biceps insertion  Pain description: throbbing  Aggravating factors: reaching overhead, sleeping,  Relieving factors: ibuprofen, nothing usually helps    PRECAUTIONS: None  RED FLAGS: None   WEIGHT BEARING RESTRICTIONS: No  FALLS:  Has patient fallen in last 6 months? No  LIVING ENVIRONMENT: Lives with: lives with their family Lives in: House/apartment   OCCUPATION: Production manager for [employer]   PLOF: Independent  PATIENT GOALS: "to be able to use my arm" - maybe be able to play golf and to not have surgery   NEXT MD VISIT:    OBJECTIVE:  Note: Objective measures were completed at Evaluation unless otherwise noted.  DIAGNOSTIC FINDINGS:  MRI 10/2023:  -- mild-mod supraspinatus tendinosis with partial-thickness midsubstance tear measuring 2mm in transverse and 2mm in AP dimensions w/in anterior supraspinatus tendon footprint -- mild superior subscapularis tendinosis -- mild longitudinal linear intermediate T2 signal within proximal long head of biceps tendon, mild-partial thickness interstitial tear -- mod degen changes of AC joint -- mild GH cartilage thinning -- linear increased signal within posteriorsuperoir glenoid labrum suspicious for degenerative tear   PATIENT SURVEYS:  Quick Dash  63.6%  COGNITION: Overall cognitive status: Within functional limits for tasks assessed     SENSATION: WFL  POSTURE: Forward head, rounded shoulders   UPPER EXTREMITY ROM:   Active ROM Right eval Left eval  Shoulder flexion 108*   Shoulder extension    Shoulder abduction 80*   Shoulder adduction    Shoulder internal rotation 55   Shoulder external rotation 2*    (Blank rows = not tested)  UPPER EXTREMITY MMT:  MMT Right eval Left eval  Shoulder flexion 2+* 5  Shoulder extension    Shoulder abduction 2+* 5  Shoulder adduction  5  Shoulder internal rotation 2+* 5  Shoulder external rotation 2+* 5  Middle trapezius    Lower trapezius    Elbow flexion 3+* 5  Elbow extension 3+* 5  (Blank rows = not tested)  SHOULDER SPECIAL TESTS: Impingement tests: Hawkins/Kennedy impingement test: positive  Rotator cuff assessment: Empty can test: positive  and Full can test: negative Biceps assessment: Yergason's test: positive  and Speed's test: positive   JOINT MOBILITY TESTING:  No significant deficits noted   PALPATION:  TTP proximal insertion of biceps tendon                                                                                                                              TREATMENT DATE: 01/02/24   Subjective: Patient is unsure about response to injections at this time. Patient reports notable soreness first thing in AM. 1-2/10 pain at baseline. He reports ongoing difficulty with accessing ER ROM and reaching behind his back (functional IR).    Manual Therapy - for symptom modulation, soft tissue sensitivity and mobility, joint mobility, ROM  PROM R shoulder flexion,scaption, abduction, and ER x 12 minutes    GHJ mobilization: inferior glide gr III for  2 x 30 sec, posterior glide gr II for  2 x 30 sec Scapular mobilization; gr III, upward rotation and posterior tilting; x 3 min DTM/STM to R pectoralis major and minor x 3 minutes  After PROM/manual  therapy: R shoulder AROM:   Flexion: 121  Abduction: 83  ER: 30     Therapeutic Exercise - for improved soft tissue flexibility and extensibility as needed for ROM, shoulder complex progressive motion  Upper body ergometer, 2 minutes forward, 2 minutes backward - for tissue warm-up to improve muscle performance, improved soft tissue mobility/extensibility - 2 minutes unbilled time, subjective gathered intermittently   AROM R shoulder flexion in supine; x 15  -up to 135 deg actively in supine Serratus punch; 1x10 with dowel, 1x10 with dowel + 4#AW Sidelying abduction on L side; 1x15  - AROM 0 to 85 deg prior to pain barrier AAROM R shoulder ER with pt in standing; 1 x 10 Wall slide, forward elevation and scaption, 5x, 5 sec hold  -pain in mid-range of scaption  *next visit* Reactive isometrics; flexion, ER, IR  PATIENT EDUCATION: Updated HEP to include serratus activation to improve ability to access shoulder elevation.    *not today* Pulleys in shoulder flexion and abduction x 1 minute each  AAROM R shoulder flexion with dowel x 20    -138 deg obtained Wall ladder R shoulder flexion and abduction x 1 minute each      PATIENT EDUCATION: Education details: HEP, POC, goals  Person educated: Patient Education method: Explanation, Demonstration, and Handouts Education comprehension: verbalized understanding and returned demonstration  HOME EXERCISE PROGRAM: Access Code: [email or web address] URL: [email or web address] Date: 01/02/2024 Prepared by: Consuela Mimes  Exercises - Supine Shoulder Flexion with Dowel  - 2-3 x daily - 5-7 x weekly - 3 sets - 10 reps - Supine Shoulder Abduction AAROM with Dowel  - 2-3 x daily - 5-7 x weekly - 3 sets - 10 reps - Serratus Punch with Dowel  - 2 x daily - 5-7 x weekly - 2 sets - 10 reps - Standing Isometric Shoulder Abduction with Doorway - Arm Bent  - 2-3 x daily - 5-7 x weekly - 3 sets - 10 reps - 5 second hold - Standing  Isometric Shoulder Flexion with Doorway - Arm Bent  - 2-3 x daily - 5-7 x weekly - 3 sets - 10 reps - 5 second  hold - Standing Isometric Shoulder External Rotation with Doorway  - 2-3 x daily - 5-7 x weekly - 3 sets - 10 reps - 5 second hold  ASSESSMENT:  CLINICAL IMPRESSION:   Pt exhibits moderate deficit with scapulothoracic upward rotation and posterior tilting. Following this and completion of serratus activation, pt is able to more readily access active/unassisted forward elevation in supine. He is still severely limited with external rotation ROM. Passive flexion, scaption, and ER in scapular plane are gradually improving. We will progress into reactive isometrics for RTC and LHB next visit. Patient will benefit from skilled PT interventions to address listed impairments to improve quality of life and reduce pain.     OBJECTIVE IMPAIRMENTS: decreased activity tolerance, decreased endurance, decreased ROM, decreased strength, impaired flexibility, impaired sensation, impaired UE functional use, improper body mechanics, postural dysfunction, and pain.   ACTIVITY LIMITATIONS: carrying, lifting, bed mobility, bathing, toileting, and dressing  PARTICIPATION LIMITATIONS: cleaning, laundry, occupation, and yard work  PERSONAL FACTORS: Age, Behavior pattern, Fitness, and Time since onset of injury/illness/exacerbation are also affecting patient's functional outcome.   REHAB  POTENTIAL: Good  CLINICAL DECISION MAKING: Evolving/moderate complexity  EVALUATION COMPLEXITY: Moderate   GOALS: Goals reviewed with patient? Yes  SHORT TERM GOALS: Target date: 01/21/2024  Patient will be independent in HEP to improve strength/mobility for better functional independence with ADLs. Baseline: 2/24: HEP initiated  Goal status: INITIAL   LONG TERM GOALS: Target date: 02/18/2024   Patient will decrease Quick DASH score <40 points demonstrating reduced self-reported upper extremity  disability. Baseline: 2/24: 63.6% Goal status: INITIAL  2.  Patient will improve AROM of R shoulder to symmetrical to L UE for improved ability to perform ADLs and iADLs.  Baseline: 2/24: see above  Goal status: INITIAL  3.  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   Baseline: 2/24: see above Goal status: INITIAL  4.  Patient will report <3/10 pain at worst on NRPS in R shoulder with activity to demonstrate improved tolerance and ability to complete ADLs and iADLs.  Baseline: 2/24: 10/10 Goal status: INITIAL   PLAN:  PT FREQUENCY: 1-2x/week  PT DURATION: 8 weeks  PLANNED INTERVENTIONS: 97164- PT Re-evaluation, 97110-Therapeutic exercises, 97530- Therapeutic activity, 97112- Neuromuscular re-education, 97535- Self Care, 16109- Manual therapy, G0283- Electrical stimulation (unattended), [phone]- Electrical stimulation (manual), Patient/Family education, Taping, Dry Needling, Joint mobilization, Joint manipulation, Cryotherapy, and Moist heat  PLAN FOR NEXT SESSION: PROM/AAROM R shoulder, isometrics, dry needling as warranted to pec   Consuela Mimes, PT, DPT [phone] Physical Therapist - [facility]  01/02/2024, 8:02 AM

## 2024-01-07 ENCOUNTER — Ambulatory Visit: Payer: Self-pay | Admitting: Physical Therapy

## 2024-01-07 ENCOUNTER — Encounter: Payer: Self-pay | Admitting: Physical Therapy

## 2024-01-07 DIAGNOSIS — M25611 Stiffness of right shoulder, not elsewhere classified: Secondary | ICD-10-CM

## 2024-01-07 DIAGNOSIS — M79621 Pain in right upper arm: Secondary | ICD-10-CM

## 2024-01-07 DIAGNOSIS — M6281 Muscle weakness (generalized): Secondary | ICD-10-CM

## 2024-01-07 NOTE — Therapy (Signed)
 OUTPATIENT PHYSICAL THERAPY SHOULDER TREATMENT   Patient Name: Danny Maxwell MRN: [id number] DOB:10-22-1971, 53 y.o., male Today's Date: 01/07/2024  END OF SESSION:  PT End of Session - 01/07/24 0730     Visit Number 5    Number of Visits 17    Date for PT Re-Evaluation 02/18/24    PT Start Time 0730    PT Stop Time 0817    PT Time Calculation (min) 47 min    Activity Tolerance Patient tolerated treatment well    Behavior During Therapy [facility] for tasks assessed/performed                 Past Medical History:  Diagnosis Date   High cholesterol    History reviewed. No pertinent surgical history. There are no active problems to display for this patient.   PCP: Lupita Raider, MD   REFERRING PROVIDER: Hurman Horn, MD   REFERRING DIAG: M75.101 (ICD-10-CM) - Rotator cuff syndrome of right shoulder  THERAPY DIAG:  Pain in right upper arm  Muscle weakness (generalized)  Stiffness of right shoulder, not elsewhere classified  Rationale for Evaluation and Treatment: Rehabilitation  ONSET DATE: 07/2023  SUBJECTIVE:                                                                                                                                                                                      SUBJECTIVE STATEMENT: Patient reports that he is undergoing an ultrasound guided injection at [facility]. Patient reports throbbing pain in his R shoulder. He believes it started in September 2024 but going to [employer] 09[place]23 made it worse. Unable to sleep on R, unable lift without pain, difficulty reaching overhead and reaching behind back as well as across chest. 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   Patient wants to be ready by the time he goes to the [place] in mid April.   Hand dominance: Right  PERTINENT HISTORY: Per MD note on 12/12/23, "Right shoulder pain: partial rotator cuff tear, biceps tendinopathy, possible labral tear   Patient presents with worsening right shoulder pain and decreased mobility since January. MRI shows mild mid to anterior supraspinatus tendinosis with 2mm x 2mm partial thickness tear, mild superior subscapularis tendinosis, partial tearing and swelling of proximal longhead biceps tendon, possible labral tear, moderate AC joint degenerative changes, and mild glenohumeral cartilage thinning. Pain exacerbated by reaching across and overhead movements, with radiation down arm. Morning stiffness improves with heat. Previous treatments (Tylenol, ibuprofen, shoulder injection) provided minimal relief. "   PAIN:  Are you having pain? Yes: NPRS scale: 1-2/10; worst 10/10 Pain location: anterior R  shoulder at proximal biceps insertion  Pain description: throbbing  Aggravating factors: reaching overhead, sleeping,  Relieving factors: ibuprofen, nothing usually helps    PRECAUTIONS: None  RED FLAGS: None   WEIGHT BEARING RESTRICTIONS: No  FALLS:  Has patient fallen in last 6 months? No  LIVING ENVIRONMENT: Lives with: lives with their family Lives in: House/apartment   OCCUPATION: Production manager for [employer]   PLOF: Independent  PATIENT GOALS: "to be able to use my arm" - maybe be able to play golf and to not have surgery   NEXT MD VISIT:    OBJECTIVE:  Note: Objective measures were completed at Evaluation unless otherwise noted.  DIAGNOSTIC FINDINGS:  MRI 10/2023:  -- mild-mod supraspinatus tendinosis with partial-thickness midsubstance tear measuring 2mm in transverse and 2mm in AP dimensions w/in anterior supraspinatus tendon footprint -- mild superior subscapularis tendinosis -- mild longitudinal linear intermediate T2 signal within proximal long head of biceps tendon, mild-partial thickness interstitial tear -- mod degen changes of AC joint -- mild GH cartilage thinning -- linear increased signal within posteriorsuperoir glenoid labrum suspicious for degenerative tear   PATIENT  SURVEYS:  Quick Dash 63.6%  COGNITION: Overall cognitive status: Within functional limits for tasks assessed     SENSATION: WFL  POSTURE: Forward head, rounded shoulders   UPPER EXTREMITY ROM:   Active ROM Right eval Left eval  Shoulder flexion 108*   Shoulder extension    Shoulder abduction 80*   Shoulder adduction    Shoulder internal rotation 55   Shoulder external rotation 2*    (Blank rows = not tested)  UPPER EXTREMITY MMT:  MMT Right eval Left eval  Shoulder flexion 2+* 5  Shoulder extension    Shoulder abduction 2+* 5  Shoulder adduction  5  Shoulder internal rotation 2+* 5  Shoulder external rotation 2+* 5  Middle trapezius    Lower trapezius    Elbow flexion 3+* 5  Elbow extension 3+* 5  (Blank rows = not tested)  SHOULDER SPECIAL TESTS: Impingement tests: Hawkins/Kennedy impingement test: positive  Rotator cuff assessment: Empty can test: positive  and Full can test: negative Biceps assessment: Yergason's test: positive  and Speed's test: positive   JOINT MOBILITY TESTING:  No significant deficits noted   PALPATION:  TTP proximal insertion of biceps tendon                                                                                                                              TREATMENT DATE: 01/07/24   Subjective: Patient reports stiffness at arrival to clinic. He reports working it over the weekend, but not completing strenuous activity. He states he hasn't performed isometrics, but he has completed other activities. Patient reports pain is dependent on position of arm/movement of arm.    Manual Therapy - for symptom modulation, soft tissue sensitivity and mobility, joint mobility, ROM  PROM R shoulder flexion,scaption, abduction, and ER x 12 minutes    GHJ  mobilization: inferior glide gr III for 2 x 30 sec, posterior glide gr II for  2 x 30 sec DTM/pin and stretch R pectoralis major and minor x 3 minutes  After PROM/manual therapy: R  shoulder AROM:   Flexion: 130  Abduction: 85     *next visit* Scapular mobilization; gr III, upward rotation and posterior tilting; x 3 min   Therapeutic Exercise - for improved soft tissue flexibility and extensibility as needed for ROM, shoulder complex progressive motion  Upper body ergometer, 2 minutes forward, 2 minutes backward - for tissue warm-up to improve muscle performance, improved soft tissue mobility/extensibility - interval history gathered during this time  AAROM with dowel; horiz abduction/adduction; x20 Sidelying abduction on L side; 1x15  - AROM 0 to 90 deg AAROM R shoulder ER with arm at 60 deg abduction, pt in supine; 1 x 10  Serratus slide at wall with foam roller; 1 x 15  Reactive isometrics; ER, IR; 2 x 10, Red Tband  PATIENT EDUCATION: Discussed expected progression of therapeutic exercise.    *not today* AROM R shoulder flexion in supine; x 15  -up to 135 deg actively in supine Serratus punch; 1x10 with dowel, 1x10 with dowel + 4#AW Wall slide, forward elevation and scaption, 5x, 5 sec hold  -pain in mid-range of scaption Pulleys in shoulder flexion and abduction x 1 minute each  AAROM R shoulder flexion with dowel x 20    -138 deg obtained Wall ladder R shoulder flexion and abduction x 1 minute each      PATIENT EDUCATION: Education details: HEP, POC, goals  Person educated: Patient Education method: Explanation, Demonstration, and Handouts Education comprehension: verbalized understanding and returned demonstration  HOME EXERCISE PROGRAM: Access Code: [email or web address] URL: [email or web address] Date: 01/02/2024 Prepared by: Consuela Mimes  Exercises - Supine Shoulder Flexion with Dowel  - 2-3 x daily - 5-7 x weekly - 3 sets - 10 reps - Supine Shoulder Abduction AAROM with Dowel  - 2-3 x daily - 5-7 x weekly - 3 sets - 10 reps - Serratus Punch with Dowel  - 2 x daily - 5-7 x weekly - 2 sets - 10 reps - Standing Isometric  Shoulder Abduction with Doorway - Arm Bent  - 2-3 x daily - 5-7 x weekly - 3 sets - 10 reps - 5 second hold - Standing Isometric Shoulder Flexion with Doorway - Arm Bent  - 2-3 x daily - 5-7 x weekly - 3 sets - 10 reps - 5 second  hold - Standing Isometric Shoulder External Rotation with Doorway  - 2-3 x daily - 5-7 x weekly - 3 sets - 10 reps - 5 second hold  ASSESSMENT:  CLINICAL IMPRESSION:   Pt exhibits improving horizontal abduction and horizontal adduction ROM. Pt is able to achieve overhead forward elevation passively and active-assisted. He is most limited with external rotation and behind-back (functional IR) AROM. Patient tolerates progression to reactive isometrics well. Patient will benefit from skilled PT interventions to address listed impairments to improve quality of life and reduce pain.     OBJECTIVE IMPAIRMENTS: decreased activity tolerance, decreased endurance, decreased ROM, decreased strength, impaired flexibility, impaired sensation, impaired UE functional use, improper body mechanics, postural dysfunction, and pain.   ACTIVITY LIMITATIONS: carrying, lifting, bed mobility, bathing, toileting, and dressing  PARTICIPATION LIMITATIONS: cleaning, laundry, occupation, and yard work  PERSONAL FACTORS: Age, Behavior pattern, Fitness, and Time since onset of injury/illness/exacerbation are also affecting patient's functional outcome.   REHAB POTENTIAL: Good  CLINICAL  DECISION MAKING: Evolving/moderate complexity  EVALUATION COMPLEXITY: Moderate   GOALS: Goals reviewed with patient? Yes  SHORT TERM GOALS: Target date: 01/21/2024  Patient will be independent in HEP to improve strength/mobility for better functional independence with ADLs. Baseline: 2/24: HEP initiated  Goal status: INITIAL   LONG TERM GOALS: Target date: 02/18/2024   Patient will decrease Quick DASH score <40 points demonstrating reduced self-reported upper extremity disability. Baseline: 2/24:  63.6% Goal status: INITIAL  2.  Patient will improve AROM of R shoulder to symmetrical to L UE for improved ability to perform ADLs and iADLs.  Baseline: 2/24: see above  Goal status: INITIAL  3.  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   Baseline: 2/24: see above Goal status: INITIAL  4.  Patient will report <3/10 pain at worst on NRPS in R shoulder with activity to demonstrate improved tolerance and ability to complete ADLs and iADLs.  Baseline: 2/24: 10/10 Goal status: INITIAL   PLAN:  PT FREQUENCY: 1-2x/week  PT DURATION: 8 weeks  PLANNED INTERVENTIONS: 97164- PT Re-evaluation, 97110-Therapeutic exercises, 97530- Therapeutic activity, 97112- Neuromuscular re-education, 97535- Self Care, 16109- Manual therapy, G0283- Electrical stimulation (unattended), [phone]- Electrical stimulation (manual), Patient/Family education, Taping, Dry Needling, Joint mobilization, Joint manipulation, Cryotherapy, and Moist heat  PLAN FOR NEXT SESSION: PROM/AAROM R shoulder, isometrics, dry needling as warranted to pec   Consuela Mimes, PT, DPT [phone] Physical Therapist - [facility]  01/07/2024, 8:19 AM

## 2024-01-09 ENCOUNTER — Encounter: Payer: Self-pay | Admitting: Physical Therapy

## 2024-01-09 ENCOUNTER — Ambulatory Visit: Payer: Self-pay | Admitting: Physical Therapy

## 2024-01-09 DIAGNOSIS — M6281 Muscle weakness (generalized): Secondary | ICD-10-CM

## 2024-01-09 DIAGNOSIS — M79621 Pain in right upper arm: Secondary | ICD-10-CM

## 2024-01-09 DIAGNOSIS — M25611 Stiffness of right shoulder, not elsewhere classified: Secondary | ICD-10-CM

## 2024-01-09 NOTE — Therapy (Signed)
 OUTPATIENT PHYSICAL THERAPY SHOULDER TREATMENT   Patient Name: Danny Maxwell MRN: [id number] DOB:1971/09/21, 53 y.o., male Today's Date: 01/09/2024  END OF SESSION:  PT End of Session - 01/09/24 0735     Visit Number 6    Number of Visits 17    Date for PT Re-Evaluation 02/18/24    PT Start Time 0733    PT Stop Time 0819    PT Time Calculation (min) 46 min    Activity Tolerance Patient tolerated treatment well    Behavior During Therapy [facility] for tasks assessed/performed             Past Medical History:  Diagnosis Date   High cholesterol    No past surgical history on file. There are no active problems to display for this patient.   PCP: Lupita Raider, MD   REFERRING PROVIDER: Hurman Horn, MD   REFERRING DIAG: M75.101 (ICD-10-CM) - Rotator cuff syndrome of right shoulder  THERAPY DIAG:  Pain in right upper arm  Muscle weakness (generalized)  Stiffness of right shoulder, not elsewhere classified  Rationale for Evaluation and Treatment: Rehabilitation  ONSET DATE: 07/2023  SUBJECTIVE:                                                                                                                                                                                      SUBJECTIVE STATEMENT: Patient reports that he is undergoing an ultrasound guided injection at [facility]. Patient reports throbbing pain in his R shoulder. He believes it started in September 2024 but going to [employer] 09[place]23 made it worse. Unable to sleep on R, unable lift without pain, difficulty reaching overhead and reaching behind back as well as across chest. 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   Patient wants to be ready by the time he goes to the [place] in mid April.   Hand dominance: Right  PERTINENT HISTORY: Per MD note on 12/12/23, "Right shoulder pain: partial rotator cuff tear, biceps tendinopathy, possible labral tear  Patient presents with  worsening right shoulder pain and decreased mobility since January. MRI shows mild mid to anterior supraspinatus tendinosis with 2mm x 2mm partial thickness tear, mild superior subscapularis tendinosis, partial tearing and swelling of proximal longhead biceps tendon, possible labral tear, moderate AC joint degenerative changes, and mild glenohumeral cartilage thinning. Pain exacerbated by reaching across and overhead movements, with radiation down arm. Morning stiffness improves with heat. Previous treatments (Tylenol, ibuprofen, shoulder injection) provided minimal relief. "   PAIN:  Are you having pain? Yes: NPRS scale: 1-2/10; worst 10/10 Pain location: anterior R shoulder at proximal biceps  insertion  Pain description: throbbing  Aggravating factors: reaching overhead, sleeping,  Relieving factors: ibuprofen, nothing usually helps    PRECAUTIONS: None  RED FLAGS: None   WEIGHT BEARING RESTRICTIONS: No  FALLS:  Has patient fallen in last 6 months? No  LIVING ENVIRONMENT: Lives with: lives with their family Lives in: House/apartment   OCCUPATION: Production manager for [employer]   PLOF: Independent  PATIENT GOALS: "to be able to use my arm" - maybe be able to play golf and to not have surgery   NEXT MD VISIT:    OBJECTIVE:  Note: Objective measures were completed at Evaluation unless otherwise noted.  DIAGNOSTIC FINDINGS:  MRI 10/2023:  -- mild-mod supraspinatus tendinosis with partial-thickness midsubstance tear measuring 2mm in transverse and 2mm in AP dimensions w/in anterior supraspinatus tendon footprint -- mild superior subscapularis tendinosis -- mild longitudinal linear intermediate T2 signal within proximal long head of biceps tendon, mild-partial thickness interstitial tear -- mod degen changes of AC joint -- mild GH cartilage thinning -- linear increased signal within posteriorsuperoir glenoid labrum suspicious for degenerative tear   PATIENT SURVEYS:  Quick Dash  63.6%  COGNITION: Overall cognitive status: Within functional limits for tasks assessed     SENSATION: WFL  POSTURE: Forward head, rounded shoulders   UPPER EXTREMITY ROM:   Active ROM Right eval Left eval  Shoulder flexion 108*   Shoulder extension    Shoulder abduction 80*   Shoulder adduction    Shoulder internal rotation 55   Shoulder external rotation 2*    (Blank rows = not tested)  UPPER EXTREMITY MMT:  MMT Right eval Left eval  Shoulder flexion 2+* 5  Shoulder extension    Shoulder abduction 2+* 5  Shoulder adduction  5  Shoulder internal rotation 2+* 5  Shoulder external rotation 2+* 5  Middle trapezius    Lower trapezius    Elbow flexion 3+* 5  Elbow extension 3+* 5  (Blank rows = not tested)  SHOULDER SPECIAL TESTS: Impingement tests: Hawkins/Kennedy impingement test: positive  Rotator cuff assessment: Empty can test: positive  and Full can test: negative Biceps assessment: Yergason's test: positive  and Speed's test: positive   JOINT MOBILITY TESTING:  No significant deficits noted   PALPATION:  TTP proximal insertion of biceps tendon                                                                                                                              TREATMENT DATE: 01/09/24   Subjective: Patient reports no pain at arrival. 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 Patient reports pain mainly along medial upper arm with movement into ER and abduction. Pt is compliant with HEP.    Manual Therapy - for symptom modulation, soft tissue sensitivity and mobility, joint mobility, ROM  PROM R shoulder flexion,scaption, abduction, and ER x 12 minutes   -PROM forward elevation 154 deg   GHJ mobilization: inferior glide gr  III for 3 x 30 sec, posterior glide gr II-III for  2 x 30 sec Scapular mobilization; gr III, emphasis on upward rotation; x 2 min  -WNL posterior tilt and elevation/depression   After  PROM/manual therapy: R shoulder AROM:   Flexion: 130  Abduction: 85  *not today* DTM/pin and stretch R pectoralis major and minor x 3 minutes   Therapeutic Exercise - for improved soft tissue flexibility and extensibility as needed for ROM, shoulder complex progressive motion  Upper body ergometer, 2 minutes forward, 2 minutes backward - for tissue warm-up to improve muscle performance, improved soft tissue mobility/extensibility - interval history gathered during this time  AAROM with dowel; horiz abduction/adduction; x20 Sidelying abduction on L side; 1x15  - AROM 0 to 90 deg AAROM R shoulder ER with arm propped into 90 deg scaption, on handrail; 2 x 10  Serratus slide at wall with foam roller; 1 x 15  Resisted Tband adduction, ABD AAROM; x20  Reactive isometrics; ER, flexion; 2 x 10, Red Tband  PATIENT EDUCATION: HEP update/review (see program below).    *not today* AROM R shoulder flexion in supine; x 15  -up to 135 deg actively in supine Serratus punch; 1x10 with dowel, 1x10 with dowel + 4#AW Wall slide, forward elevation and scaption, 5x, 5 sec hold  -pain in mid-range of scaption Pulleys in shoulder flexion and abduction x 1 minute each  AAROM R shoulder flexion with dowel x 20    -138 deg obtained Wall ladder R shoulder flexion and abduction x 1 minute each      PATIENT EDUCATION: Education details: HEP, POC, goals  Person educated: Patient Education method: Explanation, Demonstration, and Handouts Education comprehension: verbalized understanding and returned demonstration  HOME EXERCISE PROGRAM: Access Code: [email or web address] URL: [email or web address] Date: 01/02/2024 Prepared by: Consuela Mimes  Exercises - Supine Shoulder Flexion with Dowel  - 2-3 x daily - 5-7 x weekly - 3 sets - 10 reps - Supine Shoulder Abduction AAROM with Dowel  - 2-3 x daily - 5-7 x weekly - 3 sets - 10 reps - Serratus Punch with Dowel  - 2 x daily - 5-7 x weekly - 2  sets - 10 reps - Standing Isometric Shoulder Abduction with Doorway - Arm Bent  - 2-3 x daily - 5-7 x weekly - 3 sets - 10 reps - 5 second hold - Standing Isometric Shoulder Flexion with Doorway - Arm Bent  - 2-3 x daily - 5-7 x weekly - 3 sets - 10 reps - 5 second  hold - Standing Isometric Shoulder External Rotation with Doorway  - 2-3 x daily - 5-7 x weekly - 3 sets - 10 reps - 5 second hold  ASSESSMENT:  CLINICAL IMPRESSION:   Patient has slowly improving forward elevation and scaption ROM. Pt is most limited in ER ROM, but he is able to access external rotation well with arm at 90 deg scaption. Pt tolerates newly added isometrics well; her HEP was updated to include ER and LHB isometric walkouts. Patient will benefit from skilled PT interventions to address listed impairments to improve quality of life and reduce pain.     OBJECTIVE IMPAIRMENTS: decreased activity tolerance, decreased endurance, decreased ROM, decreased strength, impaired flexibility, impaired sensation, impaired UE functional use, improper body mechanics, postural dysfunction, and pain.   ACTIVITY LIMITATIONS: carrying, lifting, bed mobility, bathing, toileting, and dressing  PARTICIPATION LIMITATIONS: cleaning, laundry, occupation, and yard work  PERSONAL FACTORS: Age, Behavior pattern, Fitness, and Time since onset of  injury/illness/exacerbation are also affecting patient's functional outcome.   REHAB POTENTIAL: Good  CLINICAL DECISION MAKING: Evolving/moderate complexity  EVALUATION COMPLEXITY: Moderate   GOALS: Goals reviewed with patient? Yes  SHORT TERM GOALS: Target date: 01/21/2024  Patient will be independent in HEP to improve strength/mobility for better functional independence with ADLs. Baseline: 2/24: HEP initiated  Goal status: INITIAL   LONG TERM GOALS: Target date: 02/18/2024   Patient will decrease Quick DASH score <40 points demonstrating reduced self-reported upper extremity  disability. Baseline: 2/24: 63.6% Goal status: INITIAL  2.  Patient will improve AROM of R shoulder to symmetrical to L UE for improved ability to perform ADLs and iADLs.  Baseline: 2/24: see above  Goal status: INITIAL  3.  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   Baseline: 2/24: see above Goal status: INITIAL  4.  Patient will report <3/10 pain at worst on NRPS in R shoulder with activity to demonstrate improved tolerance and ability to complete ADLs and iADLs.  Baseline: 2/24: 10/10 Goal status: INITIAL   PLAN:  PT FREQUENCY: 1-2x/week  PT DURATION: 8 weeks  PLANNED INTERVENTIONS: 97164- PT Re-evaluation, 97110-Therapeutic exercises, 97530- Therapeutic activity, 97112- Neuromuscular re-education, 97535- Self Care, 16109- Manual therapy, G0283- Electrical stimulation (unattended), [phone]- Electrical stimulation (manual), Patient/Family education, Taping, Dry Needling, Joint mobilization, Joint manipulation, Cryotherapy, and Moist heat  PLAN FOR NEXT SESSION: PROM/AAROM R shoulder, isometrics, dry needling as warranted to pec   Consuela Mimes, PT, DPT [phone] Physical Therapist - [facility]  01/09/2024, 8:20 AM

## 2024-01-14 ENCOUNTER — Ambulatory Visit: Payer: Self-pay | Admitting: Physical Therapy

## 2024-01-16 ENCOUNTER — Ambulatory Visit: Payer: Self-pay | Admitting: Physical Therapy

## 2024-01-21 ENCOUNTER — Ambulatory Visit: Payer: Self-pay | Admitting: Physical Therapy

## 2024-01-21 ENCOUNTER — Encounter: Payer: Self-pay | Admitting: Physical Therapy

## 2024-01-21 DIAGNOSIS — M25611 Stiffness of right shoulder, not elsewhere classified: Secondary | ICD-10-CM

## 2024-01-21 DIAGNOSIS — M6281 Muscle weakness (generalized): Secondary | ICD-10-CM

## 2024-01-21 DIAGNOSIS — M79621 Pain in right upper arm: Secondary | ICD-10-CM | POA: Diagnosis not present

## 2024-01-21 NOTE — Therapy (Signed)
 OUTPATIENT PHYSICAL THERAPY SHOULDER TREATMENT   Patient Name: Danny Maxwell MRN: [id number] DOB:June 23, 1971, 53 y.o., male Today's Date: 01/21/2024  END OF SESSION:  PT End of Session - 01/21/24 0726     Visit Number 7    Number of Visits 17    Date for PT Re-Evaluation 02/18/24    PT Start Time 0728    PT Stop Time 0815    PT Time Calculation (min) 47 min    Activity Tolerance Patient tolerated treatment well    Behavior During Therapy [facility] for tasks assessed/performed             Past Medical History:  Diagnosis Date   High cholesterol    History reviewed. No pertinent surgical history. There are no active problems to display for this patient.   PCP: Lupita Raider, MD   REFERRING PROVIDER: Hurman Horn, MD   REFERRING DIAG: M75.101 (ICD-10-CM) - Rotator cuff syndrome of right shoulder  THERAPY DIAG:  Pain in right upper arm  Muscle weakness (generalized)  Stiffness of right shoulder, not elsewhere classified  Rationale for Evaluation and Treatment: Rehabilitation  ONSET DATE: 07/2023  SUBJECTIVE:                                                                                                                                                                                      SUBJECTIVE STATEMENT: Patient reports that he is undergoing an ultrasound guided injection at [facility]. Patient reports throbbing pain in his R shoulder. He believes it started in September 2024 but going to [employer] 09/2023 made it worse. Unable to sleep on R, unable lift without pain, difficulty reaching overhead and reaching behind back as well as across chest. 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   Patient wants to be ready by the time he goes to the [place] in mid April.   Hand dominance: Right  PERTINENT HISTORY: Per MD note on 12/12/23, "Right shoulder pain: partial rotator cuff tear, biceps tendinopathy, possible labral tear  Patient  presents with worsening right shoulder pain and decreased mobility since January. MRI shows mild mid to anterior supraspinatus tendinosis with 2mm x 2mm partial thickness tear, mild superior subscapularis tendinosis, partial tearing and swelling of proximal longhead biceps tendon, possible labral tear, moderate AC joint degenerative changes, and mild glenohumeral cartilage thinning. Pain exacerbated by reaching across and overhead movements, with radiation down arm. Morning stiffness improves with heat. Previous treatments (Tylenol, ibuprofen, shoulder injection) provided minimal relief. "   PAIN:  Are you having pain? Yes: NPRS scale: 1-2/10; worst 10/10 Pain location: anterior R shoulder at proximal biceps  insertion  Pain description: throbbing  Aggravating factors: reaching overhead, sleeping,  Relieving factors: ibuprofen, nothing usually helps    PRECAUTIONS: None  RED FLAGS: None   WEIGHT BEARING RESTRICTIONS: No  FALLS:  Has patient fallen in last 6 months? No  LIVING ENVIRONMENT: Lives with: lives with their family Lives in: House/apartment   OCCUPATION: Production manager for [employer]   PLOF: Independent  PATIENT GOALS: "to be able to use my arm" - maybe be able to play golf and to not have surgery   NEXT MD VISIT:    OBJECTIVE:  Note: Objective measures were completed at Evaluation unless otherwise noted.  DIAGNOSTIC FINDINGS:  MRI 10/2023:  -- mild-mod supraspinatus tendinosis with partial-thickness midsubstance tear measuring 2mm in transverse and 2mm in AP dimensions w/in anterior supraspinatus tendon footprint -- mild superior subscapularis tendinosis -- mild longitudinal linear intermediate T2 signal within proximal long head of biceps tendon, mild-partial thickness interstitial tear -- mod degen changes of AC joint -- mild GH cartilage thinning -- linear increased signal within posteriorsuperoir glenoid labrum suspicious for degenerative tear   PATIENT SURVEYS:   Quick Dash 63.6%  COGNITION: Overall cognitive status: Within functional limits for tasks assessed     SENSATION: WFL  POSTURE: Forward head, rounded shoulders   UPPER EXTREMITY ROM:   Active ROM Right eval Left eval  Shoulder flexion 108*   Shoulder extension    Shoulder abduction 80*   Shoulder adduction    Shoulder internal rotation 55   Shoulder external rotation 2*    (Blank rows = not tested)  UPPER EXTREMITY MMT:  MMT Right eval Left eval  Shoulder flexion 2+* 5  Shoulder extension    Shoulder abduction 2+* 5  Shoulder adduction  5  Shoulder internal rotation 2+* 5  Shoulder external rotation 2+* 5  Middle trapezius    Lower trapezius    Elbow flexion 3+* 5  Elbow extension 3+* 5  (Blank rows = not tested)  SHOULDER SPECIAL TESTS: Impingement tests: Hawkins/Kennedy impingement test: positive  Rotator cuff assessment: Empty can test: positive  and Full can test: negative Biceps assessment: Yergason's test: positive  and Speed's test: positive   JOINT MOBILITY TESTING:  No significant deficits noted   PALPATION:  TTP proximal insertion of biceps tendon                                                                                                                               TREATMENT DATE: 01/21/24   Subjective: Patient had to miss last week due to flu. Pt had positive flu test. Pt is better now. Pt did not complete home exercises over this previous week. Pt reports his R shoulder feels stiff at arrival.    Manual Therapy - for symptom modulation, soft tissue sensitivity and mobility, joint mobility, ROM  PROM R shoulder flexion,scaption, abduction, and ER x 10 minutes    GHJ mobilization: inferior glide gr III for 3 x  30 sec, posterior glide gr II-III for  2 x 30 sec Scapular mobilization; gr III, emphasis on posterior tilt and upward rotation; x 3 min   *not today* DTM/pin and stretch R pectoralis major and minor x 3  minutes   Therapeutic Exercise - for improved soft tissue flexibility and extensibility as needed for ROM, shoulder complex progressive motion  Upper body ergometer, 2 minutes forward, 2 minutes backward - for tissue warm-up to improve muscle performance, improved soft tissue mobility/extensibility - interval history gathered during this time   Sidelying abduction on L side; 1x15  - AROM 0 to 90 deg AAROM with dowel; horiz abduction/adduction; 2x10  AAROM R shoulder ER with arm propped into 90 deg scaption, on handrail; 2 x 10  Serratus slide at wall with foam roller; 2x10  Resisted Tband adduction, ABD AAROM; x20  Functional IR (hand behind back) with blanket; x15, 3 sec hold  -within pain tolerance  PATIENT EDUCATION: HEP review (see program below).    *not today* Reactive isometrics; ER, flexion; 2 x 10, Red Tband AROM R shoulder flexion in supine; x 15  -up to 135 deg actively in supine Serratus punch; 1x10 with dowel, 1x10 with dowel + 4#AW Wall slide, forward elevation and scaption, 5x, 5 sec hold  -pain in mid-range of scaption Pulleys in shoulder flexion and abduction x 1 minute each  AAROM R shoulder flexion with dowel x 20    -138 deg obtained Wall ladder R shoulder flexion and abduction x 1 minute each      PATIENT EDUCATION: Education details: HEP, POC, goals  Person educated: Patient Education method: Explanation, Demonstration, and Handouts Education comprehension: verbalized understanding and returned demonstration  HOME EXERCISE PROGRAM: Access Code: [email or web address] URL: [email or web address] Date: 01/02/2024 Prepared by: Consuela Mimes  Exercises - Supine Shoulder Flexion with Dowel  - 2-3 x daily - 5-7 x weekly - 3 sets - 10 reps - Supine Shoulder Abduction AAROM with Dowel  - 2-3 x daily - 5-7 x weekly - 3 sets - 10 reps - Serratus Punch with Dowel  - 2 x daily - 5-7 x weekly - 2 sets - 10 reps - Standing Isometric Shoulder Abduction  with Doorway - Arm Bent  - 2-3 x daily - 5-7 x weekly - 3 sets - 10 reps - 5 second hold - Standing Isometric Shoulder Flexion with Doorway - Arm Bent  - 2-3 x daily - 5-7 x weekly - 3 sets - 10 reps - 5 second  hold - Standing Isometric Shoulder External Rotation with Doorway  - 2-3 x daily - 5-7 x weekly - 3 sets - 10 reps - 5 second hold  ASSESSMENT:  CLINICAL IMPRESSION:   Patient was unable to complete HEP this past week due to being ill with positive flu test and significant cough/respiratory symptoms. Pt is better now, and he fortunately has not lost significant progress. He does exhibit notable motion loss and stiffness with shoulder complex abduction and GHJ IR. Shoulder elevation and ER at 90 deg ABD is markedly improving. Pt is still notably limited with compound movements required for swinging golf club and performing pushing/pulling tasks.Patient will benefit from skilled PT interventions to address listed impairments to improve quality of life and reduce pain.     OBJECTIVE IMPAIRMENTS: decreased activity tolerance, decreased endurance, decreased ROM, decreased strength, impaired flexibility, impaired sensation, impaired UE functional use, improper body mechanics, postural dysfunction, and pain.   ACTIVITY LIMITATIONS: carrying, lifting, bed mobility, bathing, toileting, and dressing  PARTICIPATION LIMITATIONS: cleaning, laundry, occupation, and yard work  PERSONAL FACTORS: Age, Behavior pattern, Fitness, and Time since onset of injury/illness/exacerbation are also affecting patient's functional outcome.   REHAB POTENTIAL: Good  CLINICAL DECISION MAKING: Evolving/moderate complexity  EVALUATION COMPLEXITY: Moderate   GOALS: Goals reviewed with patient? Yes  SHORT TERM GOALS: Target date: 01/21/2024  Patient will be independent in HEP to improve strength/mobility for better functional independence with ADLs. Baseline: 2/24: HEP initiated  Goal status: INITIAL   LONG TERM  GOALS: Target date: 02/18/2024   Patient will decrease Quick DASH score <40 points demonstrating reduced self-reported upper extremity disability. Baseline: 2/24: 63.6% Goal status: INITIAL  2.  Patient will improve AROM of R shoulder to symmetrical to L UE for improved ability to perform ADLs and iADLs.  Baseline: 2/24: see above  Goal status: INITIAL  3.  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   Baseline: 2/24: see above Goal status: INITIAL  4.  Patient will report <3/10 pain at worst on NRPS in R shoulder with activity to demonstrate improved tolerance and ability to complete ADLs and iADLs.  Baseline: 2/24: 10/10 Goal status: INITIAL   PLAN:  PT FREQUENCY: 1-2x/week  PT DURATION: 8 weeks  PLANNED INTERVENTIONS: 97164- PT Re-evaluation, 97110-Therapeutic exercises, 97530- Therapeutic activity, 97112- Neuromuscular re-education, 97535- Self Care, [id number]- Manual therapy, [id number]- Electrical stimulation (unattended), [phone]- Electrical stimulation (manual), Patient/Family education, Taping, Dry Needling, Joint mobilization, Joint manipulation, Cryotherapy, and Moist heat  PLAN FOR NEXT SESSION: PROM/AAROM R shoulder, isometrics, dry needling as warranted to pec   Consuela Mimes, PT, DPT [phone] Physical Therapist - [facility] [place]  01/21/2024, 8:18 AM

## 2024-01-23 ENCOUNTER — Encounter: Payer: Self-pay | Admitting: Physical Therapy

## 2024-01-23 ENCOUNTER — Ambulatory Visit: Payer: Self-pay | Admitting: Physical Therapy

## 2024-01-23 DIAGNOSIS — M6281 Muscle weakness (generalized): Secondary | ICD-10-CM

## 2024-01-23 DIAGNOSIS — M79621 Pain in right upper arm: Secondary | ICD-10-CM

## 2024-01-23 DIAGNOSIS — M25611 Stiffness of right shoulder, not elsewhere classified: Secondary | ICD-10-CM

## 2024-01-23 NOTE — Therapy (Signed)
 OUTPATIENT PHYSICAL THERAPY SHOULDER TREATMENT   Patient Name: Danny Maxwell MRN: [id number] DOB:08/26/1971, 53 y.o., male Today's Date: 01/23/2024  END OF SESSION:  PT End of Session - 01/23/24 0758     Visit Number 8    Number of Visits 17    Date for PT Re-Evaluation 02/18/24    PT Start Time 0731    PT Stop Time 0815    PT Time Calculation (min) 44 min    Activity Tolerance Patient tolerated treatment well    Behavior During Therapy [facility] for tasks assessed/performed             Past Medical History:  Diagnosis Date   High cholesterol    History reviewed. No pertinent surgical history. There are no active problems to display for this patient.   PCP: Lupita Raider, MD   REFERRING PROVIDER: Hurman Horn, MD   REFERRING DIAG: M75.101 (ICD-10-CM) - Rotator cuff syndrome of right shoulder  THERAPY DIAG:  Pain in right upper arm  Muscle weakness (generalized)  Stiffness of right shoulder, not elsewhere classified  Rationale for Evaluation and Treatment: Rehabilitation  ONSET DATE: 07/2023  SUBJECTIVE:                                                                                                                                                                                      SUBJECTIVE STATEMENT: Patient reports that he is undergoing an ultrasound guided injection at [facility]. Patient reports throbbing pain in his R shoulder. He believes it started in September 2024 but going to [employer] 09[place]2023 made it worse. Unable to sleep on R, unable lift without pain, difficulty reaching overhead and reaching behind back as well as across chest. 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   Patient wants to be ready by the time he goes to the [place] in mid April.   Hand dominance: Right  PERTINENT HISTORY: Per MD note on 12/12/23, "Right shoulder pain: partial rotator cuff tear, biceps tendinopathy, possible labral tear  Patient  presents with worsening right shoulder pain and decreased mobility since January. MRI shows mild mid to anterior supraspinatus tendinosis with 2mm x 2mm partial thickness tear, mild superior subscapularis tendinosis, partial tearing and swelling of proximal longhead biceps tendon, possible labral tear, moderate AC joint degenerative changes, and mild glenohumeral cartilage thinning. Pain exacerbated by reaching across and overhead movements, with radiation down arm. Morning stiffness improves with heat. Previous treatments (Tylenol, ibuprofen, shoulder injection) provided minimal relief. "   PAIN:  Are you having pain? Yes: NPRS scale: 1-2/10; worst 10/10 Pain location: anterior R shoulder at proximal biceps  insertion  Pain description: throbbing  Aggravating factors: reaching overhead, sleeping,  Relieving factors: ibuprofen, nothing usually helps    PRECAUTIONS: None  RED FLAGS: None   WEIGHT BEARING RESTRICTIONS: No  FALLS:  Has patient fallen in last 6 months? No  LIVING ENVIRONMENT: Lives with: lives with their family Lives in: House/apartment   OCCUPATION: Production manager for [employer]   PLOF: Independent  PATIENT GOALS: "to be able to use my arm" - maybe be able to play golf and to not have surgery   NEXT MD VISIT:    OBJECTIVE:  Note: Objective measures were completed at Evaluation unless otherwise noted.  DIAGNOSTIC FINDINGS:  MRI 10/2023:  -- mild-mod supraspinatus tendinosis with partial-thickness midsubstance tear measuring 2mm in transverse and 2mm in AP dimensions w/in anterior supraspinatus tendon footprint -- mild superior subscapularis tendinosis -- mild longitudinal linear intermediate T2 signal within proximal long head of biceps tendon, mild-partial thickness interstitial tear -- mod degen changes of AC joint -- mild GH cartilage thinning -- linear increased signal within posteriorsuperoir glenoid labrum suspicious for degenerative tear   PATIENT SURVEYS:   Quick Dash 63.6%  COGNITION: Overall cognitive status: Within functional limits for tasks assessed     SENSATION: WFL  POSTURE: Forward head, rounded shoulders   UPPER EXTREMITY ROM:   Active ROM Right eval Left eval  Shoulder flexion 108*   Shoulder extension    Shoulder abduction 80*   Shoulder adduction    Shoulder internal rotation 55   Shoulder external rotation 2*    (Blank rows = not tested)  UPPER EXTREMITY MMT:  MMT Right eval Left eval  Shoulder flexion 2+* 5  Shoulder extension    Shoulder abduction 2+* 5  Shoulder adduction  5  Shoulder internal rotation 2+* 5  Shoulder external rotation 2+* 5  Middle trapezius    Lower trapezius    Elbow flexion 3+* 5  Elbow extension 3+* 5  (Blank rows = not tested)  SHOULDER SPECIAL TESTS: Impingement tests: Hawkins/Kennedy impingement test: positive  Rotator cuff assessment: Empty can test: positive  and Full can test: negative Biceps assessment: Yergason's test: positive  and Speed's test: positive   JOINT MOBILITY TESTING:  No significant deficits noted   PALPATION:  TTP proximal insertion of biceps tendon                                                                                                                               TREATMENT DATE: 01/23/24   Subjective: Patient had follow-up with referring physician yesterday,and she was happy with progress with shoulder ROM. Pt feels that his shoulder elevation and functional IR (hand behind back) motion has improved modestly.   Manual Therapy - for symptom modulation, soft tissue sensitivity and mobility, joint mobility, ROM  PROM R shoulder flexion,scaption, abduction, and ER with gentle overpressure into each direction as tolerated; x 10 minutes    GHJ mobilization: inferior glide gr III for 3  x 30 sec, posterior glide gr II-III for  2 x 30 sec    *not today* Scapular mobilization; gr III, emphasis on posterior tilt and upward rotation; x 3  min DTM/pin and stretch R pectoralis major and minor x 3 minutes   Therapeutic Exercise - for improved soft tissue flexibility and extensibility as needed for ROM, shoulder complex progressive motion  Upper body ergometer, 2 minutes forward, 2 minutes backward - for tissue warm-up to improve muscle performance, improved soft tissue mobility/extensibility - interval history gathered during this time   Sidelying abduction on L side; 1x20  - AROM 0 to 107 deg  Serratus slide at wall with foam roller, Red TBand; 2x8  Resisted Tband adduction, ABD AAROM; x20  Wall slide into abduction; x10, 5 sec   AAROM R shoulder ER with arm propped into 90 deg scaption, on handrail; 2 x 10  Functional IR pass with 1-lb Dbell; 20x CW/CCW   PATIENT EDUCATION: Discussed current POC and expected re-evaluation next week.    *not today* Functional IR (hand behind back) with blanket; x15, 3 sec hold  -within pain tolerance AAROM with dowel; horiz abduction/adduction; 2x10 Reactive isometrics; ER, flexion; 2 x 10, Red Tband AROM R shoulder flexion in supine; x 15  -up to 135 deg actively in supine Serratus punch; 1x10 with dowel, 1x10 with dowel + 4#AW Wall slide, forward elevation and scaption, 5x, 5 sec hold  -pain in mid-range of scaption Pulleys in shoulder flexion and abduction x 1 minute each  AAROM R shoulder flexion with dowel x 20    -138 deg obtained Wall ladder R shoulder flexion and abduction x 1 minute each      PATIENT EDUCATION: Education details: HEP, POC, goals  Person educated: Patient Education method: Explanation, Demonstration, and Handouts Education comprehension: verbalized understanding and returned demonstration  HOME EXERCISE PROGRAM: Access Code: [email or web address] URL: [email or web address] Date: 01/02/2024 Prepared by: Consuela Mimes  Exercises - Supine Shoulder Flexion with Dowel  - 2-3 x daily - 5-7 x weekly - 3 sets - 10 reps - Supine Shoulder  Abduction AAROM with Dowel  - 2-3 x daily - 5-7 x weekly - 3 sets - 10 reps - Serratus Punch with Dowel  - 2 x daily - 5-7 x weekly - 2 sets - 10 reps - Standing Isometric Shoulder Abduction with Doorway - Arm Bent  - 2-3 x daily - 5-7 x weekly - 3 sets - 10 reps - 5 second hold - Standing Isometric Shoulder Flexion with Doorway - Arm Bent  - 2-3 x daily - 5-7 x weekly - 3 sets - 10 reps - 5 second  hold - Standing Isometric Shoulder External Rotation with Doorway  - 2-3 x daily - 5-7 x weekly - 3 sets - 10 reps - 5 second hold  ASSESSMENT:  CLINICAL IMPRESSION:   Patient has markedly improved R shoulder ROM, but he is still limited with ER at 90/90, functional ER, and functional IR. Forward elevation is nearing symmetry with contralateral UE. Pt is still limited with compound pushing/pulling movements during ADLs. Pt had f/u with physician, and he was instructed on continuing PT at this time with satisfactory report. Patient will benefit from skilled PT interventions to address listed impairments to improve quality of life and reduce pain.     OBJECTIVE IMPAIRMENTS: decreased activity tolerance, decreased endurance, decreased ROM, decreased strength, impaired flexibility, impaired sensation, impaired UE functional use, improper body mechanics, postural dysfunction, and pain.   ACTIVITY LIMITATIONS: carrying,  lifting, bed mobility, bathing, toileting, and dressing  PARTICIPATION LIMITATIONS: cleaning, laundry, occupation, and yard work  PERSONAL FACTORS: Age, Behavior pattern, Fitness, and Time since onset of injury/illness/exacerbation are also affecting patient's functional outcome.   REHAB POTENTIAL: Good  CLINICAL DECISION MAKING: Evolving/moderate complexity  EVALUATION COMPLEXITY: Moderate   GOALS: Goals reviewed with patient? Yes  SHORT TERM GOALS: Target date: 01/21/2024  Patient will be independent in HEP to improve strength/mobility for better functional independence with  ADLs. Baseline: 2/24: HEP initiated  Goal status: INITIAL   LONG TERM GOALS: Target date: 02/18/2024   Patient will decrease Quick DASH score <40 points demonstrating reduced self-reported upper extremity disability. Baseline: 2/24: 63.6% Goal status: INITIAL  2.  Patient will improve AROM of R shoulder to symmetrical to L UE for improved ability to perform ADLs and iADLs.  Baseline: 2/24: see above  Goal status: INITIAL  3.  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   Baseline: 2/24: see above Goal status: INITIAL  4.  Patient will report <3/10 pain at worst on NRPS in R shoulder with activity to demonstrate improved tolerance and ability to complete ADLs and iADLs.  Baseline: 2/24: 10/10 Goal status: INITIAL   PLAN:  PT FREQUENCY: 1-2x/week  PT DURATION: 8 weeks  PLANNED INTERVENTIONS: 97164- PT Re-evaluation, 97110-Therapeutic exercises, 97530- Therapeutic activity, 97112- Neuromuscular re-education, 97535- Self Care, 16109- Manual therapy, [id number]- Electrical stimulation (unattended), [phone]- Electrical stimulation (manual), Patient/Family education, Taping, Dry Needling, Joint mobilization, Joint manipulation, Cryotherapy, and Moist heat  PLAN FOR NEXT SESSION: PROM/AAROM R shoulder, isometrics, dry needling as warranted to pec   Consuela Mimes, PT, DPT [phone] Physical Therapist - [facility]  01/23/2024, 7:58 AM

## 2024-01-28 ENCOUNTER — Ambulatory Visit: Payer: Self-pay | Admitting: Physical Therapy

## 2024-01-28 ENCOUNTER — Encounter: Payer: Self-pay | Admitting: Physical Therapy

## 2024-01-28 DIAGNOSIS — M79621 Pain in right upper arm: Secondary | ICD-10-CM | POA: Diagnosis not present

## 2024-01-28 DIAGNOSIS — M6281 Muscle weakness (generalized): Secondary | ICD-10-CM

## 2024-01-28 DIAGNOSIS — M25611 Stiffness of right shoulder, not elsewhere classified: Secondary | ICD-10-CM

## 2024-01-28 NOTE — Therapy (Signed)
 OUTPATIENT PHYSICAL THERAPY SHOULDER TREATMENT   Patient Name: Danny Maxwell MRN: [id number] DOB:Dec 27, 1970, 53 y.o., male Today's Date: 01/28/2024  END OF SESSION:  PT End of Session - 01/28/24 0730     Visit Number 9    Number of Visits 17    Date for PT Re-Evaluation 02/18/24    PT Start Time 0730    PT Stop Time 0815    PT Time Calculation (min) 45 min    Activity Tolerance Patient tolerated treatment well    Behavior During Therapy [facility] for tasks assessed/performed             Past Medical History:  Diagnosis Date   High cholesterol    History reviewed. No pertinent surgical history. There are no active problems to display for this patient.   PCP: Lupita Raider, MD   REFERRING PROVIDER: Hurman Horn, MD   REFERRING DIAG: M75.101 (ICD-10-CM) - Rotator cuff syndrome of right shoulder  THERAPY DIAG:  Pain in right upper arm  Muscle weakness (generalized)  Stiffness of right shoulder, not elsewhere classified  Rationale for Evaluation and Treatment: Rehabilitation  ONSET DATE: 07/2023  SUBJECTIVE:                                                                                                                                                                                      SUBJECTIVE STATEMENT: Patient reports that he is undergoing an ultrasound guided injection at [facility]. Patient reports throbbing pain in his R shoulder. He believes it started in September 2024 but going to [employer] 09/[place]23 made it worse. Unable to sleep on R, unable lift without pain, difficulty reaching overhead and reaching behind back as well as across chest. 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   Patient wants to be ready by the time he goes to the [place] in mid April.   Hand dominance: Right  PERTINENT HISTORY: Per MD note on 12/12/23, "Right shoulder pain: partial rotator cuff tear, biceps tendinopathy, possible labral tear  Patient  presents with worsening right shoulder pain and decreased mobility since January. MRI shows mild mid to anterior supraspinatus tendinosis with 2mm x 2mm partial thickness tear, mild superior subscapularis tendinosis, partial tearing and swelling of proximal longhead biceps tendon, possible labral tear, moderate AC joint degenerative changes, and mild glenohumeral cartilage thinning. Pain exacerbated by reaching across and overhead movements, with radiation down arm. Morning stiffness improves with heat. Previous treatments (Tylenol, ibuprofen, shoulder injection) provided minimal relief. "   PAIN:  Are you having pain? Yes: NPRS scale: 1-2/10; worst 10/10 Pain location: anterior R shoulder at proximal biceps  insertion  Pain description: throbbing  Aggravating factors: reaching overhead, sleeping,  Relieving factors: ibuprofen, nothing usually helps    PRECAUTIONS: None  RED FLAGS: None   WEIGHT BEARING RESTRICTIONS: No  FALLS:  Has patient fallen in last 6 months? No  LIVING ENVIRONMENT: Lives with: lives with their family Lives in: House/apartment   OCCUPATION: Production manager for [employer]   PLOF: Independent  PATIENT GOALS: "to be able to use my arm" - maybe be able to play golf and to not have surgery   NEXT MD VISIT:    OBJECTIVE:  Note: Objective measures were completed at Evaluation unless otherwise noted.  DIAGNOSTIC FINDINGS:  MRI 10/2023:  -- mild-mod supraspinatus tendinosis with partial-thickness midsubstance tear measuring 2mm in transverse and 2mm in AP dimensions w/in anterior supraspinatus tendon footprint -- mild superior subscapularis tendinosis -- mild longitudinal linear intermediate T2 signal within proximal long head of biceps tendon, mild-partial thickness interstitial tear -- mod degen changes of AC joint -- mild GH cartilage thinning -- linear increased signal within posteriorsuperoir glenoid labrum suspicious for degenerative tear   PATIENT SURVEYS:   Quick Dash 63.6%  COGNITION: Overall cognitive status: Within functional limits for tasks assessed     SENSATION: WFL  POSTURE: Forward head, rounded shoulders   UPPER EXTREMITY ROM:   Active ROM Right eval Left eval  Shoulder flexion 108*   Shoulder extension    Shoulder abduction 80*   Shoulder adduction    Shoulder internal rotation 55   Shoulder external rotation 2*    (Blank rows = not tested)  UPPER EXTREMITY MMT:  MMT Right eval Left eval  Shoulder flexion 2+* 5  Shoulder extension    Shoulder abduction 2+* 5  Shoulder adduction  5  Shoulder internal rotation 2+* 5  Shoulder external rotation 2+* 5  Middle trapezius    Lower trapezius    Elbow flexion 3+* 5  Elbow extension 3+* 5  (Blank rows = not tested)  SHOULDER SPECIAL TESTS: Impingement tests: Hawkins/Kennedy impingement test: positive  Rotator cuff assessment: Empty can test: positive  and Full can test: negative Biceps assessment: Yergason's test: positive  and Speed's test: positive   JOINT MOBILITY TESTING:  No significant deficits noted   PALPATION:  TTP proximal insertion of biceps tendon                                                                                                                               TREATMENT DATE: 01/28/24   Subjective: Patient is undergoing extracorporeal shockwave therapy with [facility] in [place]. He states this helped substantially with previous plantar fasciitis; he has another treatment tomorrow. Pt reports 2/10 NPRS at arrival.    Manual Therapy - for symptom modulation, soft tissue sensitivity and mobility, joint mobility, ROM  PROM R shoulder flexion,scaption, abduction, and ER with gentle overpressure into each direction as tolerated; x 10 minutes    GHJ mobilization: inferior glide gr III  for 2 x 30 sec, posterior glide gr II-III for  3 x 30 sec STM anterior and middle deltoid, biceps; x 2 minutes   *not today* Scapular  mobilization; gr III, emphasis on posterior tilt and upward rotation; x 3 min DTM/pin and stretch R pectoralis major and minor x 3 minutes   Therapeutic Exercise - for improved soft tissue flexibility and extensibility as needed for ROM, shoulder complex progressive motion  Upper body ergometer, 2 minutes forward, 2 minutes backward - for tissue warm-up to improve muscle performance, improved soft tissue mobility/extensibility - interval history gathered during this time, 2 min not billed   Sidelying abduction on L side; 1x20  - AROM 0 to 100 deg  Sidelying external rotation; 1x15; 2-lb Dbell   Serratus slide at wall with foam roller, Red TBand; 2x8  Wall slide into abduction; x10, 5 sec   Functional IR (hand behind back) with blanket; x15, 3 sec hold  -within pain tolerance  -attempted with dowel, pt does not have enough IR ROM to reach dowel behind back  Functional IR pass with 1-lb Dbell; 20x CW/CCW  PATIENT EDUCATION: Discussed current POC and expected re-evaluation next week.    *not today* Resisted Tband adduction, ABD AAROM; x20 AAROM R shoulder ER with arm propped into 90 deg scaption, on handrail; 2 x 10 AAROM with dowel; horiz abduction/adduction; 2x10 Reactive isometrics; ER, flexion; 2 x 10, Red Tband AROM R shoulder flexion in supine; x 15  -up to 135 deg actively in supine Serratus punch; 1x10 with dowel, 1x10 with dowel + 4#AW Wall slide, forward elevation and scaption, 5x, 5 sec hold  -pain in mid-range of scaption Pulleys in shoulder flexion and abduction x 1 minute each  AAROM R shoulder flexion with dowel x 20    -138 deg obtained Wall ladder R shoulder flexion and abduction x 1 minute each      PATIENT EDUCATION: Education details: HEP, POC, goals  Person educated: Patient Education method: Explanation, Demonstration, and Handouts Education comprehension: verbalized understanding and returned demonstration  HOME EXERCISE PROGRAM: Access Code:  [email or web address] URL: [email or web address] Date: 01/02/2024 Prepared by: Consuela Mimes  Exercises - Supine Shoulder Flexion with Dowel  - 2-3 x daily - 5-7 x weekly - 3 sets - 10 reps - Supine Shoulder Abduction AAROM with Dowel  - 2-3 x daily - 5-7 x weekly - 3 sets - 10 reps - Serratus Punch with Dowel  - 2 x daily - 5-7 x weekly - 2 sets - 10 reps - Standing Isometric Shoulder Abduction with Doorway - Arm Bent  - 2-3 x daily - 5-7 x weekly - 3 sets - 10 reps - 5 second hold - Standing Isometric Shoulder Flexion with Doorway - Arm Bent  - 2-3 x daily - 5-7 x weekly - 3 sets - 10 reps - 5 second  hold - Standing Isometric Shoulder External Rotation with Doorway  - 2-3 x daily - 5-7 x weekly - 3 sets - 10 reps - 5 second hold  ASSESSMENT:  CLINICAL IMPRESSION:   Patient's ROM overall has improved substantially, especially given significant stiffness and mobility deficits at baseline. Pt is also undergoing extracorporeal shockwave therapy for R shoulder, which historically helped him significantly with musculoskeletal pain. Pt exhibits grossly WFL forward elevation and markedly improved external rotation with arm at 90 deg scaption. Pt is most limited with functional IR/hand behind back ROM. Pt due for re-assessment next visit. Patient will benefit from skilled PT interventions to address  listed impairments to improve quality of life and reduce pain.     OBJECTIVE IMPAIRMENTS: decreased activity tolerance, decreased endurance, decreased ROM, decreased strength, impaired flexibility, impaired sensation, impaired UE functional use, improper body mechanics, postural dysfunction, and pain.   ACTIVITY LIMITATIONS: carrying, lifting, bed mobility, bathing, toileting, and dressing  PARTICIPATION LIMITATIONS: cleaning, laundry, occupation, and yard work  PERSONAL FACTORS: Age, Behavior pattern, Fitness, and Time since onset of injury/illness/exacerbation are also affecting patient's  functional outcome.   REHAB POTENTIAL: Good  CLINICAL DECISION MAKING: Evolving/moderate complexity  EVALUATION COMPLEXITY: Moderate   GOALS: Goals reviewed with patient? Yes  SHORT TERM GOALS: Target date: 01/21/2024  Patient will be independent in HEP to improve strength/mobility for better functional independence with ADLs. Baseline: 2/24: HEP initiated  Goal status: INITIAL   LONG TERM GOALS: Target date: 02/18/2024   Patient will decrease Quick DASH score <40 points demonstrating reduced self-reported upper extremity disability. Baseline: 2/24: 63.6% Goal status: INITIAL  2.  Patient will improve AROM of R shoulder to symmetrical to L UE for improved ability to perform ADLs and iADLs.  Baseline: 2/24: see above  Goal status: INITIAL  3.  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   Baseline: 2/24: see above Goal status: INITIAL  4.  Patient will report <3/10 pain at worst on NRPS in R shoulder with activity to demonstrate improved tolerance and ability to complete ADLs and iADLs.  Baseline: 2/24: 10/10 Goal status: INITIAL   PLAN:  PT FREQUENCY: 1-2x/week  PT DURATION: 8 weeks  PLANNED INTERVENTIONS: 97164- PT Re-evaluation, 97110-Therapeutic exercises, 97530- Therapeutic activity, 97112- Neuromuscular re-education, 97535- Self Care, 91478- Manual therapy, [id number]- Electrical stimulation (unattended), [phone]- Electrical stimulation (manual), Patient/Family education, Taping, Dry Needling, Joint mobilization, Joint manipulation, Cryotherapy, and Moist heat  PLAN FOR NEXT SESSION: PROM/AAROM R shoulder, isometrics, dry needling as warranted to pec.      Pt due for re-assessment next visit.    Consuela Mimes, PT, DPT [phone] Physical Therapist - [facility]  01/28/2024, 8:27 AM

## 2024-01-28 NOTE — Therapy (Unsigned)
 OUTPATIENT PHYSICAL THERAPY TREATMENT AND PROGRESS NOTE   Dates of reporting period  12/24/23   to   01/30/24    Patient Name: Danny Maxwell MRN: [id number] DOB:11/19/1970, 53 y.o., male Today's Date: 01/30/2024  END OF SESSION:  PT End of Session - 01/30/24 0730     Visit Number 10    Number of Visits 17    Date for PT Re-Evaluation 02/18/24    PT Start Time 1930    PT Stop Time 2013    PT Time Calculation (min) 43 min    Activity Tolerance Patient tolerated treatment well    Behavior During Therapy [facility] for tasks assessed/performed              Past Medical History:  Diagnosis Date   High cholesterol    No past surgical history on file. There are no active problems to display for this patient.   PCP: Lupita Raider, MD   REFERRING PROVIDER: Hurman Horn, MD   REFERRING DIAG: M75.101 (ICD-10-CM) - Rotator cuff syndrome of right shoulder  THERAPY DIAG:  Pain in right upper arm  Muscle weakness (generalized)  Stiffness of right shoulder, not elsewhere classified  Rationale for Evaluation and Treatment: Rehabilitation  ONSET DATE: 07/2023  SUBJECTIVE:                                                                                                                                                                                      SUBJECTIVE STATEMENT: Patient reports that he is undergoing an ultrasound guided injection at [facility]. Patient reports throbbing pain in his R shoulder. He believes it started in September 2024 but going to [employer] 09[place]2023 made it worse. Unable to sleep on R, unable lift without pain, difficulty reaching overhead and reaching behind back as well as across chest. 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   Patient wants to be ready by the time he goes to the [place] in mid April.   Hand dominance: Right  PERTINENT HISTORY: Per MD note on 12/12/23, "Right shoulder pain: partial rotator cuff tear,  biceps tendinopathy, possible labral tear  Patient presents with worsening right shoulder pain and decreased mobility since January. MRI shows mild mid to anterior supraspinatus tendinosis with 2mm x 2mm partial thickness tear, mild superior subscapularis tendinosis, partial tearing and swelling of proximal longhead biceps tendon, possible labral tear, moderate AC joint degenerative changes, and mild glenohumeral cartilage thinning. Pain exacerbated by reaching across and overhead movements, with radiation down arm. Morning stiffness improves with heat. Previous treatments (Tylenol, ibuprofen, shoulder injection) provided minimal relief. "   PAIN:  Are you having pain? Yes: NPRS scale: 1-2/10; worst 10/10 Pain location: anterior R shoulder at proximal biceps insertion  Pain description: throbbing  Aggravating factors: reaching overhead, sleeping,  Relieving factors: ibuprofen, nothing usually helps    PRECAUTIONS: None  RED FLAGS: None   WEIGHT BEARING RESTRICTIONS: No  FALLS:  Has patient fallen in last 6 months? No  LIVING ENVIRONMENT: Lives with: lives with their family Lives in: House/apartment   OCCUPATION: Production manager for [employer]   PLOF: Independent  PATIENT GOALS: "to be able to use my arm" - maybe be able to play golf and to not have surgery   NEXT MD VISIT:    OBJECTIVE:  Note: Objective measures were completed at Evaluation unless otherwise noted.  DIAGNOSTIC FINDINGS:  MRI 10/2023:  -- mild-mod supraspinatus tendinosis with partial-thickness midsubstance tear measuring 2mm in transverse and 2mm in AP dimensions w/in anterior supraspinatus tendon footprint -- mild superior subscapularis tendinosis -- mild longitudinal linear intermediate T2 signal within proximal long head of biceps tendon, mild-partial thickness interstitial tear -- mod degen changes of AC joint -- mild GH cartilage thinning -- linear increased signal within posteriorsuperoir glenoid labrum  suspicious for degenerative tear   PATIENT SURVEYS:  Quick Dash 63.6%  COGNITION: Overall cognitive status: Within functional limits for tasks assessed     SENSATION: WFL  POSTURE: Forward head, rounded shoulders   UPPER EXTREMITY ROM:   Active ROM Right eval Left eval Right 01/30/24 Left 01/30/24  Shoulder flexion 108*  135 156  Shoulder extension      Shoulder abduction 80*  145 169  Shoulder adduction      Shoulder internal rotation 55  42 WNL  Shoulder external rotation 2*   75 86  Functional ER      Functional IR      (Blank rows = not tested)  UPPER EXTREMITY MMT:  MMT Right eval Left eval Right 01/30/24 Left 01/30/24  Shoulder flexion 2+* 5 5 (pain with release of MMT) 5  Shoulder extension      Shoulder abduction 2+* 5 4* 5  Shoulder adduction  5    Shoulder internal rotation 2+* 5 5 5   Shoulder external rotation 2+* 5 4+ 5  Middle trapezius      Lower trapezius      Elbow flexion 3+* 5 5 5   Elbow extension 3+* 5 5 5   (Blank rows = not tested)  SHOULDER SPECIAL TESTS: Impingement tests: Hawkins/Kennedy impingement test: positive  Rotator cuff assessment: Empty can test: positive  and Full can test: negative Biceps assessment: Yergason's test: positive  and Speed's test: positive   JOINT MOBILITY TESTING:  No significant deficits noted   PALPATION:  TTP proximal insertion of biceps tendon                                                                                                                               TREATMENT DATE: 01/30/24   Subjective:  Patient reports 50-60% global rating of function. Patient reports most difficulty with heavier pushing/pulling and lifting activity. Pt reports his mobility has notably improved - 70% better. Patient reports 8/10 pain at worst with bumping his R arm inadvertently and overhead pushing. Patient reports no notable pain at baseline.    *GOAL UPDATE PERFORMED   Manual Therapy - for symptom modulation, soft  tissue sensitivity and mobility, joint mobility, ROM  PROM R shoulder flexion,scaption, abduction, and ER with gentle overpressure into each direction as tolerated; x 10 minutes    GHJ mobilization: inferior glide gr III for 2 x 30 sec, posterior glide gr II-III for  3 x 30 sec   *not today* STM anterior and middle deltoid, biceps; x 2 minutes Scapular mobilization; gr III, emphasis on posterior tilt and upward rotation; x 3 min DTM/pin and stretch R pectoralis major and minor x 3 minutes   Therapeutic Exercise - for improved soft tissue flexibility and extensibility as needed for ROM, shoulder complex progressive motion  Upper body ergometer, 2 minutes forward, 2 minutes backward - for tissue warm-up to improve muscle performance, improved soft tissue mobility/extensibility - interval history gathered during this time, 2 min not billed   Wand ER with shoulder at 90 deg scaption; 2 x 10, 3 sec hold  Functional IR (hand behind back) with blanket; x10, 3 sec hold  -within pain tolerance  -added to HEP    PATIENT EDUCATION: Discussed goals met and further work needed in PT; discussed readiness for tapering of PT frequency.    *next visit* Sidelying external rotation; 1x15; 2-lb Dbell  Serratus slide at wall with foam roller, Red TBand; 2x8   *not today* Functional IR pass with 1-lb Dbell; 20x CW/CCW Wall slide into abduction; x10, 5 sec  Sidelying abduction on L side; 1x20 Resisted Tband adduction, ABD AAROM; x20 AAROM R shoulder ER with arm propped into 90 deg scaption, on handrail; 2 x 10 AAROM with dowel; horiz abduction/adduction; 2x10 Reactive isometrics; ER, flexion; 2 x 10, Red Tband AROM R shoulder flexion in supine; x 15  -up to 135 deg actively in supine Serratus punch; 1x10 with dowel, 1x10 with dowel + 4#AW Wall slide, forward elevation and scaption, 5x, 5 sec hold  -pain in mid-range of scaption Pulleys in shoulder flexion and abduction x 1 minute each  AAROM  R shoulder flexion with dowel x 20    -138 deg obtained Wall ladder R shoulder flexion and abduction x 1 minute each      PATIENT EDUCATION: Education details: HEP, POC, goals  Person educated: Patient Education method: Explanation, Demonstration, and Handouts Education comprehension: verbalized understanding and returned demonstration  HOME EXERCISE PROGRAM: Access Code: [email or web address] URL: [email or web address] Date: 01/02/2024 Prepared by: Consuela Mimes  Exercises - Supine Shoulder Flexion with Dowel  - 2-3 x daily - 5-7 x weekly - 3 sets - 10 reps - Supine Shoulder Abduction AAROM with Dowel  - 2-3 x daily - 5-7 x weekly - 3 sets - 10 reps - Serratus Punch with Dowel  - 2 x daily - 5-7 x weekly - 2 sets - 10 reps - Standing Isometric Shoulder Abduction with Doorway - Arm Bent  - 2-3 x daily - 5-7 x weekly - 3 sets - 10 reps - 5 second hold - Standing Isometric Shoulder Flexion with Doorway - Arm Bent  - 2-3 x daily - 5-7 x weekly - 3 sets - 10 reps - 5 second  hold - Standing Isometric Shoulder External  Rotation with Doorway  - 2-3 x daily - 5-7 x weekly - 3 sets - 10 reps - 5 second hold  ASSESSMENT:  CLINICAL IMPRESSION:   Patient's ROM and MMTs have significantly improved. Pt has moderate strength loss with R shoulder abduction and ER. He has markedly improved shoulder flexion/abduction/ER. Minimal change in IR noted, though this could reflect difference in position in which this was tested compared to initial eval. Passive IR in supine is comparable to number seen at eval. Pt is most limited in functional IR/behind back motion at this time. Pt has remaining deficits in R shoulder AROM, posterior cuff and deltoid strength, R shoulder stiffness, and decreased functional UE use. Patient will benefit from skilled PT interventions to address listed impairments to improve quality of life and reduce pain.     OBJECTIVE IMPAIRMENTS: decreased activity tolerance, decreased  endurance, decreased ROM, decreased strength, impaired flexibility, impaired sensation, impaired UE functional use, improper body mechanics, postural dysfunction, and pain.   ACTIVITY LIMITATIONS: carrying, lifting, bed mobility, bathing, toileting, and dressing  PARTICIPATION LIMITATIONS: cleaning, laundry, occupation, and yard work  PERSONAL FACTORS: Age, Behavior pattern, Fitness, and Time since onset of injury/illness/exacerbation are also affecting patient's functional outcome.   REHAB POTENTIAL: Good  CLINICAL DECISION MAKING: Evolving/moderate complexity  EVALUATION COMPLEXITY: Moderate   GOALS: Goals reviewed with patient? Yes  SHORT TERM GOALS: Target date: 01/21/2024  Patient will be independent in HEP to improve strength/mobility for better functional independence with ADLs. Baseline: 2/24: HEP initiated.    01/30/24: Pt is largely compliant with HEP; "not every day," but he does complete throughout week Goal status: PARTIALLY MET   LONG TERM GOALS: Target date: 02/18/2024   Patient will decrease Quick DASH score <40 points demonstrating reduced self-reported upper extremity disability. Baseline: 2/24: 63.6%    01/30/24: 27.3% Goal status: IN PROGRESS  2.  Patient will improve AROM of R shoulder to symmetrical to L UE for improved ability to perform ADLs and iADLs.  Baseline: 2/24: see above     01/30/24: Improved AROM in flexion, abduction, ER; lack of ROM compared to opposite UE Goal status: IN PROGRESS  3.  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   Baseline: 2/24: see above      01/30/24: Improved by > 1 grade Goal status: ACHIEVED  4.  Patient will report <3/10 pain at worst on NRPS in R shoulder with activity to demonstrate improved tolerance and ability to complete ADLs and iADLs.  Baseline: 2/24: 10/10    01/30/24: 8/10 at worst.  Goal status: IN PROGRESS   PLAN:  PT FREQUENCY:  1-2x/week  PT DURATION: 4 weeks  PLANNED INTERVENTIONS: 97164- PT Re-evaluation, 97110-Therapeutic exercises, 97530- Therapeutic activity, 97112- Neuromuscular re-education, 97535- Self Care, 16109- Manual therapy, [id number]- Electrical stimulation (unattended), [phone]- Electrical stimulation (manual), Patient/Family education, Taping, Dry Needling, Joint mobilization, Joint manipulation, Cryotherapy, and Moist heat  PLAN FOR NEXT SESSION: AAROM/AROM for R shoulder, manual techniques to improve ROM tolerance. Progressive strengthening of RTC, LHB, and shoulder complex stabilization drills.    Consuela Mimes, PT, DPT [phone] Physical Therapist - [facility]  01/30/2024, 7:35 AM

## 2024-01-30 ENCOUNTER — Ambulatory Visit: Attending: Sports Medicine | Admitting: Physical Therapy

## 2024-01-30 DIAGNOSIS — M79621 Pain in right upper arm: Secondary | ICD-10-CM | POA: Insufficient documentation

## 2024-01-30 DIAGNOSIS — M6281 Muscle weakness (generalized): Secondary | ICD-10-CM | POA: Insufficient documentation

## 2024-01-30 DIAGNOSIS — M25611 Stiffness of right shoulder, not elsewhere classified: Secondary | ICD-10-CM | POA: Diagnosis present

## 2024-02-20 ENCOUNTER — Encounter: Payer: Self-pay | Admitting: Physical Therapy

## 2024-02-20 ENCOUNTER — Ambulatory Visit: Admitting: Physical Therapy

## 2024-02-20 DIAGNOSIS — M25611 Stiffness of right shoulder, not elsewhere classified: Secondary | ICD-10-CM

## 2024-02-20 DIAGNOSIS — M79621 Pain in right upper arm: Secondary | ICD-10-CM | POA: Diagnosis not present

## 2024-02-20 DIAGNOSIS — M6281 Muscle weakness (generalized): Secondary | ICD-10-CM

## 2024-02-20 NOTE — Therapy (Signed)
 OUTPATIENT PHYSICAL THERAPY TREATMENT    Patient Name: Danny Maxwell MRN: [id number] DOB:[birth year], 53 y.o., male Today's Date: 02/20/2024  END OF SESSION:  PT End of Session - 02/20/24 1553     Visit Number 11    Number of Visits 17    Date for PT Re-Evaluation 02/18/24    PT Start Time 1550    PT Stop Time 1632    PT Time Calculation (min) 42 min    Activity Tolerance Patient tolerated treatment well    Behavior During Therapy [facility] for tasks assessed/performed               Past Medical History:  Diagnosis Date   High cholesterol    No past surgical history on file. There are no active problems to display for this patient.   PCP: Glena Landau, MD   REFERRING PROVIDER: Rance Burrows, MD   REFERRING DIAG: M75.101 (ICD-10-CM) - Rotator cuff syndrome of right shoulder  THERAPY DIAG:  Pain in right upper arm  Muscle weakness (generalized)  Stiffness of right shoulder, not elsewhere classified  Rationale for Evaluation and Treatment: Rehabilitation  ONSET DATE: 07/2023  SUBJECTIVE:                                                                                                                                                                                      SUBJECTIVE STATEMENT: Patient reports that he is undergoing an ultrasound guided injection at [facility]. Patient reports throbbing pain in his R shoulder. He believes it started in September 2024 but going to [employer] 09/2023 made it worse. Unable to sleep on R, unable lift without pain, difficulty reaching overhead and reaching behind back as well as across chest. 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   Patient wants to be ready by the time he goes to the [place] in mid April.   Hand dominance: Right  PERTINENT HISTORY: Per MD note on 12/12/23, "Right shoulder pain: partial rotator cuff tear, biceps tendinopathy, possible labral tear  Patient presents with  worsening right shoulder pain and decreased mobility since January. MRI shows mild mid to anterior supraspinatus tendinosis with 2mm x 2mm partial thickness tear, mild superior subscapularis tendinosis, partial tearing and swelling of proximal longhead biceps tendon, possible labral tear, moderate AC joint degenerative changes, and mild glenohumeral cartilage thinning. Pain exacerbated by reaching across and overhead movements, with radiation down arm. Morning stiffness improves with heat. Previous treatments (Tylenol, ibuprofen, shoulder injection) provided minimal relief. "   PAIN:  Are you having pain? Yes: NPRS scale: 1-2/10; worst 10/10 Pain location: anterior R shoulder at  proximal biceps insertion  Pain description: throbbing  Aggravating factors: reaching overhead, sleeping,  Relieving factors: ibuprofen, nothing usually helps    PRECAUTIONS: None  RED FLAGS: None   WEIGHT BEARING RESTRICTIONS: No  FALLS:  Has patient fallen in last 6 months? No  LIVING ENVIRONMENT: Lives with: lives with their family Lives in: House/apartment   OCCUPATION: Production manager for [employer]   PLOF: Independent  PATIENT GOALS: "to be able to use my arm" - maybe be able to play golf and to not have surgery   NEXT MD VISIT:    OBJECTIVE:  Note: Objective measures were completed at Evaluation unless otherwise noted.  DIAGNOSTIC FINDINGS:  MRI 10/2023:  -- mild-mod supraspinatus tendinosis with partial-thickness midsubstance tear measuring 2mm in transverse and 2mm in AP dimensions w/in anterior supraspinatus tendon footprint -- mild superior subscapularis tendinosis -- mild longitudinal linear intermediate T2 signal within proximal long head of biceps tendon, mild-partial thickness interstitial tear -- mod degen changes of AC joint -- mild GH cartilage thinning -- linear increased signal within posteriorsuperoir glenoid labrum suspicious for degenerative tear   PATIENT SURVEYS:  Quick Dash  63.6%  COGNITION: Overall cognitive status: Within functional limits for tasks assessed     SENSATION: WFL  POSTURE: Forward head, rounded shoulders   UPPER EXTREMITY ROM:   Active ROM Right eval Left eval Right 01/30/24 Left 01/30/24  Shoulder flexion 108*  135 156  Shoulder extension      Shoulder abduction 80*  145 169  Shoulder adduction      Shoulder internal rotation 55  42 WNL  Shoulder external rotation 2*   75 86  Functional ER      Functional IR      (Blank rows = not tested)  UPPER EXTREMITY MMT:  MMT Right eval Left eval Right 01/30/24 Left 01/30/24  Shoulder flexion 2+* 5 5 (pain with release of MMT) 5  Shoulder extension      Shoulder abduction 2+* 5 4* 5  Shoulder adduction  5    Shoulder internal rotation 2+* 5 5 5   Shoulder external rotation 2+* 5 4+ 5  Middle trapezius      Lower trapezius      Elbow flexion 3+* 5 5 5   Elbow extension 3+* 5 5 5   (Blank rows = not tested)  SHOULDER SPECIAL TESTS: Impingement tests: Hawkins/Kennedy impingement test: positive  Rotator cuff assessment: Empty can test: positive  and Full can test: negative Biceps assessment: Yergason's test: positive  and Speed's test: positive   JOINT MOBILITY TESTING:  No significant deficits noted   PALPATION:  TTP proximal insertion of biceps tendon                                                                                                                               TREATMENT DATE: 02/20/24   Subjective: Patient reports following up with Gilberto Labella and she had steroid injection in R shoulder and  last round of shockwave therapy. Patient's referring provider stated that he did have notably improved mobility.   Manual Therapy - for symptom modulation, soft tissue sensitivity and mobility, joint mobility, ROM  PROM R shoulder flexion,scaption, abduction, and ER with gentle overpressure into each direction as tolerated; x 10 minutes  GHJ mobilization: inferior glide gr  III for 2 x 30 sec, posterior glide gr II-III for  3 x 30 sec  *not today* STM anterior and middle deltoid, biceps; x 2 minutes Scapular mobilization; gr III, emphasis on posterior tilt and upward rotation; x 3 min DTM/pin and stretch R pectoralis major and minor x 3 minutes   Therapeutic Exercise - for improved soft tissue flexibility and extensibility as needed for ROM, shoulder complex progressive motion  Upper body ergometer, 2 minutes forward, 2 minutes backward - for tissue warm-up to improve muscle performance, improved soft tissue mobility/extensibility - interval history gathered during this time, 2 min not billed  Pulley shoulder AAROM; flexion and abduction x 20 reps Pulley functional IR AAROM; x 1 min, 3 sec holds  Serratus slide at wall with foam roller, Red TBand; 2x10  Standing bilat horizontal abduction with Red Tband; 2 x 10  D2 flexion RUE with Red Tband anchored to bottom link on wall; 2 x 10  Sidelying external rotation; 2x15; 3-lb Dbell    PATIENT EDUCATION: Discussed goals met and goals of remaining PT visits.      *not today* Wand ER with shoulder at 90 deg scaption; 2 x 10, 3 sec hold Functional IR pass with 1-lb Dbell; 20x CW/CCW Wall slide into abduction; x10, 5 sec  Sidelying abduction on L side; 1x20 Resisted Tband adduction, ABD AAROM; x20 AAROM R shoulder ER with arm propped into 90 deg scaption, on handrail; 2 x 10 AAROM with dowel; horiz abduction/adduction; 2x10 Reactive isometrics; ER, flexion; 2 x 10, Red Tband AROM R shoulder flexion in supine; x 15  -up to 135 deg actively in supine Serratus punch; 1x10 with dowel, 1x10 with dowel + 4#AW Wall slide, forward elevation and scaption, 5x, 5 sec hold  -pain in mid-range of scaption Pulleys in shoulder flexion and abduction x 1 minute each  AAROM R shoulder flexion with dowel x 20    -138 deg obtained Wall ladder R shoulder flexion and abduction x 1 minute each  Functional IR (hand behind  back) with blanket; x10, 3 sec hold  -within pain tolerance  -added to HEP    PATIENT EDUCATION: Education details: HEP, POC, goals  Person educated: Patient Education method: Explanation, Demonstration, and Handouts Education comprehension: verbalized understanding and returned demonstration  HOME EXERCISE PROGRAM: Access Code: [email or web address] URL: [email or web address] Date: 01/02/2024 Prepared by: Denese Finn  Exercises - Supine Shoulder Flexion with Dowel  - 2-3 x daily - 5-7 x weekly - 3 sets - 10 reps - Supine Shoulder Abduction AAROM with Dowel  - 2-3 x daily - 5-7 x weekly - 3 sets - 10 reps - Serratus Punch with Dowel  - 2 x daily - 5-7 x weekly - 2 sets - 10 reps - Standing Isometric Shoulder Abduction with Doorway - Arm Bent  - 2-3 x daily - 5-7 x weekly - 3 sets - 10 reps - 5 second hold - Standing Isometric Shoulder Flexion with Doorway - Arm Bent  - 2-3 x daily - 5-7 x weekly - 3 sets - 10 reps - 5 second  hold - Standing Isometric Shoulder External Rotation with Doorway  - 2-3 x  daily - 5-7 x weekly - 3 sets - 10 reps - 5 second hold  ASSESSMENT:  CLINICAL IMPRESSION:   Today, we progressed into combined-plane diagonal drills to emulate motion needed for backswing during driving will playing golf. Pt exhibits markedly improved shoulder elevation and ER/IR. We are continuing work also on functional IR and HABD as needed for self-care and outward reaching activities. Pt has remaining deficits in R shoulder AROM, posterior cuff and deltoid strength, R shoulder stiffness, and decreased functional UE use. Patient will benefit from skilled PT interventions to address listed impairments to improve quality of life and reduce pain.     OBJECTIVE IMPAIRMENTS: decreased activity tolerance, decreased endurance, decreased ROM, decreased strength, impaired flexibility, impaired sensation, impaired UE functional use, improper body mechanics, postural dysfunction, and pain.    ACTIVITY LIMITATIONS: carrying, lifting, bed mobility, bathing, toileting, and dressing  PARTICIPATION LIMITATIONS: cleaning, laundry, occupation, and yard work  PERSONAL FACTORS: Age, Behavior pattern, Fitness, and Time since onset of injury/illness/exacerbation are also affecting patient's functional outcome.   REHAB POTENTIAL: Good  CLINICAL DECISION MAKING: Evolving/moderate complexity  EVALUATION COMPLEXITY: Moderate   GOALS: Goals reviewed with patient? Yes  SHORT TERM GOALS: Target date: 01/21/2024  Patient will be independent in HEP to improve strength/mobility for better functional independence with ADLs. Baseline: 2/24: HEP initiated.    01/30/24: Pt is largely compliant with HEP; "not every day," but he does complete throughout week Goal status: PARTIALLY MET   LONG TERM GOALS: Target date: 02/18/2024   Patient will decrease Quick DASH score <40 points demonstrating reduced self-reported upper extremity disability. Baseline: 2/24: 63.6%    01/30/24: 27.3% Goal status: IN PROGRESS  2.  Patient will improve AROM of R shoulder to symmetrical to L UE for improved ability to perform ADLs and iADLs.  Baseline: 2/24: see above     01/30/24: Improved AROM in flexion, abduction, ER; lack of ROM compared to opposite UE Goal status: IN PROGRESS  3.  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   Baseline: 2/24: see above      01/30/24: Improved by > 1 grade Goal status: ACHIEVED  4.  Patient will report <3/10 pain at worst on NRPS in R shoulder with activity to demonstrate improved tolerance and ability to complete ADLs and iADLs.  Baseline: 2/24: 10/10    01/30/24: 8/10 at worst.  Goal status: IN PROGRESS   PLAN:  PT FREQUENCY: 1-2x/week  PT DURATION: 4 weeks  PLANNED INTERVENTIONS: 97164- PT Re-evaluation, 97110-Therapeutic exercises, 97530- Therapeutic activity, 97112- Neuromuscular re-education,  97535- Self Care, 96045- Manual therapy, [id number]- Electrical stimulation (unattended), [phone]- Electrical stimulation (manual), Patient/Family education, Taping, Dry Needling, Joint mobilization, Joint manipulation, Cryotherapy, and Moist heat  PLAN FOR NEXT SESSION: AAROM/AROM for R shoulder, manual techniques to improve ROM tolerance. Progressive strengthening of RTC, LHB, and shoulder complex stabilization drills.    Denese Finn, PT, DPT [phone] Physical Therapist - [facility]  02/20/2024, 3:54 PM

## 2024-02-27 ENCOUNTER — Encounter: Payer: Self-pay | Admitting: Physical Therapy

## 2024-02-27 ENCOUNTER — Ambulatory Visit: Admitting: Physical Therapy

## 2024-02-27 DIAGNOSIS — M79621 Pain in right upper arm: Secondary | ICD-10-CM

## 2024-02-27 DIAGNOSIS — M6281 Muscle weakness (generalized): Secondary | ICD-10-CM

## 2024-02-27 DIAGNOSIS — M25611 Stiffness of right shoulder, not elsewhere classified: Secondary | ICD-10-CM

## 2024-02-27 NOTE — Therapy (Addendum)
 OUTPATIENT PHYSICAL THERAPY TREATMENT/GOAL UPDATE AND RE-CERTIFICATION    Patient Name: Danny Maxwell MRN: [id number] DOB:01-Apr-1971, 52 y.o., male Today's Date: 02/27/2024  END OF SESSION:  PT End of Session - 02/27/24 1531     Visit Number 12    Number of Visits 17    Date for PT Re-Evaluation 02/18/24    PT Start Time 1536    PT Stop Time 1619    PT Time Calculation (min) 43 min    Activity Tolerance Patient tolerated treatment well    Behavior During Therapy [facility] for tasks assessed/performed             Past Medical History:  Diagnosis Date   High cholesterol    History reviewed. No pertinent surgical history. There are no active problems to display for this patient.   PCP: Glena Landau, MD   REFERRING PROVIDER: Rance Burrows, MD   REFERRING DIAG: M75.101 (ICD-10-CM) - Rotator cuff syndrome of right shoulder  THERAPY DIAG:  Pain in right upper arm  Muscle weakness (generalized)  Stiffness of right shoulder, not elsewhere classified  Rationale for Evaluation and Treatment: Rehabilitation  ONSET DATE: 07/2023  SUBJECTIVE:                                                                                                                                                                                      SUBJECTIVE STATEMENT: Patient reports that he is undergoing an ultrasound guided injection at [facility]. Patient reports throbbing pain in his R shoulder. He believes it started in September 2024 but going to [place] 09/2023 made it worse. Unable to sleep on R, unable lift without pain, difficulty reaching overhead and reaching behind back as well as across chest. 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   Patient wants to be ready by the time he goes to the [place] in mid April.   Hand dominance: Right  PERTINENT HISTORY: Per MD note on 12/12/23, "Right shoulder pain: partial rotator cuff tear, biceps tendinopathy, possible  labral tear  Patient presents with worsening right shoulder pain and decreased mobility since January. MRI shows mild mid to anterior supraspinatus tendinosis with 2mm x 2mm partial thickness tear, mild superior subscapularis tendinosis, partial tearing and swelling of proximal longhead biceps tendon, possible labral tear, moderate AC joint degenerative changes, and mild glenohumeral cartilage thinning. Pain exacerbated by reaching across and overhead movements, with radiation down arm. Morning stiffness improves with heat. Previous treatments (Tylenol, ibuprofen, shoulder injection) provided minimal relief. "   PAIN:  Are you having pain? Yes: NPRS scale: 1-2/10; worst 10/10 Pain location: anterior R shoulder  at proximal biceps insertion  Pain description: throbbing  Aggravating factors: reaching overhead, sleeping,  Relieving factors: ibuprofen, nothing usually helps    PRECAUTIONS: None  RED FLAGS: None   WEIGHT BEARING RESTRICTIONS: No  FALLS:  Has patient fallen in last 6 months? No  LIVING ENVIRONMENT: Lives with: lives with their family Lives in: House/apartment   OCCUPATION: Production manager for [employer]   PLOF: Independent  PATIENT GOALS: "to be able to use my arm" - maybe be able to play golf and to not have surgery   NEXT MD VISIT:    OBJECTIVE:  Note: Objective measures were completed at Evaluation unless otherwise noted.  DIAGNOSTIC FINDINGS:  MRI 10/2023:  -- mild-mod supraspinatus tendinosis with partial-thickness midsubstance tear measuring 2mm in transverse and 2mm in AP dimensions w/in anterior supraspinatus tendon footprint -- mild superior subscapularis tendinosis -- mild longitudinal linear intermediate T2 signal within proximal long head of biceps tendon, mild-partial thickness interstitial tear -- mod degen changes of AC joint -- mild GH cartilage thinning -- linear increased signal within posteriorsuperoir glenoid labrum suspicious for degenerative tear    PATIENT SURVEYS:  Quick Dash 63.6%  COGNITION: Overall cognitive status: Within functional limits for tasks assessed     SENSATION: WFL  POSTURE: Forward head, rounded shoulders   UPPER EXTREMITY ROM:   Active ROM Right eval Left eval Right 01/30/24 Left 01/30/24 Right 02/27/24  Shoulder flexion 108*  135 156 150  Shoulder extension       Shoulder abduction 80*  145 169 147  Shoulder adduction       Shoulder internal rotation 55  42 WNL 47  Shoulder external rotation 2*   75 86 75  Functional ER       Functional IR       (Blank rows = not tested)  UPPER EXTREMITY MMT:  MMT Right eval Left eval Right 01/30/24 Left 01/30/24  Shoulder flexion 2+* 5 5 (pain with release of MMT) 5  Shoulder extension      Shoulder abduction 2+* 5 4* 5  Shoulder adduction  5    Shoulder internal rotation 2+* 5 5 5   Shoulder external rotation 2+* 5 4+ 5  Middle trapezius      Lower trapezius      Elbow flexion 3+* 5 5 5   Elbow extension 3+* 5 5 5   (Blank rows = not tested)  SHOULDER SPECIAL TESTS: Impingement tests: Hawkins/Kennedy impingement test: positive  Rotator cuff assessment: Empty can test: positive  and Full can test: negative Biceps assessment: Yergason's test: positive  and Speed's test: positive   JOINT MOBILITY TESTING:  No significant deficits noted   PALPATION:  TTP proximal insertion of biceps tendon                                                                                                                               TREATMENT DATE: 02/27/24   Subjective: Patient reports moving landscape rock  at high volume over last 2 afternoons and reports notable fatigue/"feeling like Jello" due to this. He reports his shoulder and back seemed to manage well with this task. Patient feels that he is doing relatively well given notable landscaping tasks he's been able to do including moving 30 bags of topsoil with his son. Patient reports no pain at arrival; he reports more  being "sore" than painful.    *GOAL UPDATE PERFORMED   Manual Therapy - for symptom modulation, soft tissue sensitivity and mobility, joint mobility, ROM  PROM R shoulder flexion,scaption, abduction, and ER with gentle overpressure into each direction as tolerated; x 8 minutes  GHJ mobilization: posterior glide gr III for  3 x 30 sec  *not today* STM anterior and middle deltoid, biceps; x 2 minutes Scapular mobilization; gr III, emphasis on posterior tilt and upward rotation; x 3 min DTM/pin and stretch R pectoralis major and minor x 3 minutes   Therapeutic Exercise - for improved soft tissue flexibility and extensibility as needed for ROM, shoulder complex progressive motion  Upper body ergometer, 2 minutes forward, 2 minutes backward - for tissue warm-up to improve muscle performance, improved soft tissue mobility/extensibility - history gathered during this time  Sleeper stretch; x 3, 30 sec holds  Pulley functional IR AAROM; x 1 min, 3 sec holds  Nautilus lat pulldown for overhead mobility; 70-lbs; 2 x 15  Standing external rotation reactive isometric walkout; Green Tband; 1x10   -reviewed for HEP  D2 flexion RUE with Red Tband anchored to bottom link on wall; 2 x 10    PATIENT EDUCATION: Discussed goals met and progress with PT. Discussed possibility of tapered PT visits or transition to HEP. Updated/reviewed current HEP and provided updated resistance bands.    *not today* Standing bilat horizontal abduction with Red Tband; 2 x 10 Serratus slide at wall with foam roller, Red TBand; 2x10 Pulley shoulder AAROM; flexion and abduction x 20 reps Wand ER with shoulder at 90 deg scaption; 2 x 10, 3 sec hold Functional IR pass with 1-lb Dbell; 20x CW/CCW Wall slide into abduction; x10, 5 sec  Sidelying abduction on L side; 1x20 Resisted Tband adduction, ABD AAROM; x20 AAROM R shoulder ER with arm propped into 90 deg scaption, on handrail; 2 x 10 AAROM with dowel; horiz  abduction/adduction; 2x10 Reactive isometrics; ER, flexion; 2 x 10, Red Tband AROM R shoulder flexion in supine; x 15  -up to 135 deg actively in supine Serratus punch; 1x10 with dowel, 1x10 with dowel + 4#AW Wall slide, forward elevation and scaption, 5x, 5 sec hold  -pain in mid-range of scaption Pulleys in shoulder flexion and abduction x 1 minute each  AAROM R shoulder flexion with dowel x 20    -138 deg obtained Wall ladder R shoulder flexion and abduction x 1 minute each  Functional IR (hand behind back) with blanket; x10, 3 sec hold  -within pain tolerance  -added to HEP    PATIENT EDUCATION: Education details: HEP, POC, goals  Person educated: Patient Education method: Explanation, Demonstration, and Handouts Education comprehension: verbalized understanding and returned demonstration  HOME EXERCISE PROGRAM: Access Code: [email or web address] URL: [email or web address] Date: 02/27/2024 Prepared by: Denese Finn  Exercises - Supine Shoulder Flexion with Dowel  - 2-3 x daily - 5-7 x weekly - 3 sets - 10 reps - Supine Shoulder Abduction AAROM with Dowel  - 2-3 x daily - 5-7 x weekly - 3 sets - 10 reps - Sleeper Stretch  - 2-3 x daily -  5-7 x weekly - 3 sets - 30sec hold - Seated AAROM Shoulder External Rotation at 90 Degrees with Counter Support Using Cane  - 2 x daily - 5-7 x weekly - 2 sets - 10 reps - 5sec hold - Standing Shoulder Internal Rotation Stretch with Towel  - 2 x daily - 5-7 x weekly - 2 sets - 5-10 reps - 3-5sec hold - Shoulder External Rotation Reactive Isometrics  - 1 x daily - 5-7 x weekly - 2 sets - 10 reps - Standing Shoulder Single Arm PNF D2 Flexion with Anchored Resistance  - 1 x daily - 5-7 x weekly - 2 sets - 10 reps  ASSESSMENT:  CLINICAL IMPRESSION:   Pt presents with marked glenohumeral internal rotation deficit that limits ability to perform combined shoulder extension/adduction/IR needed for reaching behind back (functional IR). We  updated his in-clinic intervention and home program to include sleeper stretch for improving IR; this drill is well tolerated. He demonstrates largely normalizing shoulder elevation ROM and substantially improved GHJ ER compared to initial evaluation. Pt has met QuickDASH goal at previous re-assessment. Pt also has good strength at this time overall with MMTs; he has also been able to perform heavy laborious activity with his landscaping including moving several bags of topsoil and landscaping rock. Pt has made good progress overall with PT, especially given significant pain and mobility deficits at outset of PT. Pt has remaining deficits in R shoulder AROM, posterior cuff and deltoid strength, R shoulder stiffness, and decreased functional RUE use. Patient will benefit from skilled PT interventions to address listed impairments to improve quality of life and reduce pain. Pt elects to follow-up with MD for advise on continuing PT/conservative care.    OBJECTIVE IMPAIRMENTS: decreased activity tolerance, decreased endurance, decreased ROM, decreased strength, impaired flexibility, impaired sensation, impaired UE functional use, improper body mechanics, postural dysfunction, and pain.   ACTIVITY LIMITATIONS: carrying, lifting, bed mobility, bathing, toileting, and dressing  PARTICIPATION LIMITATIONS: cleaning, laundry, occupation, and yard work  PERSONAL FACTORS: Age, Behavior pattern, Fitness, and Time since onset of injury/illness/exacerbation are also affecting patient's functional outcome.   REHAB POTENTIAL: Good  CLINICAL DECISION MAKING: Evolving/moderate complexity  EVALUATION COMPLEXITY: Moderate   GOALS: Goals reviewed with patient? Yes  SHORT TERM GOALS: Target date: 01/21/2024  Patient will be independent in HEP to improve strength/mobility for better functional independence with ADLs. Baseline: 2/24: HEP initiated.    01/30/24: Pt is largely compliant with HEP; "not every day," but he  does complete throughout week.    02/27/24: Pt reports mostly working on functional IR and HABD AAROM.  Goal status: IN PROGRESS/MOSTLY MET    LONG TERM GOALS: Target date: 02/18/2024   Patient will decrease Quick DASH score to <40 points demonstrating reduced self-reported upper extremity disability. Baseline: 2/24: 63.6%    01/30/24: 27.3% Goal status: ACHIEVED  2.  Patient will improve AROM of R shoulder to symmetrical to L UE for improved ability to perform ADLs and iADLs.  Baseline: 2/24: see above     01/30/24: Improved AROM in flexion, abduction, ER; lack of ROM compared to opposite UE. 02/27/24: Within 10 deg difference for flexion; > 10 deg difference for abduction, ER, IR.  Goal status: IN PROGRESS  3.  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   Baseline: 2/24: see above      01/30/24: Improved by > 1 grade Goal status:  ACHIEVED  4.  Patient will report <3/10 pain at worst on NRPS in R shoulder with activity to demonstrate improved tolerance and ability to complete ADLs and iADLs.  Baseline: 2/24: 10/10    01/30/24: 8/10 at worst.   02/27/24: 5/10 at worst.  Goal status: IN PROGRESS   PLAN:  PT FREQUENCY: 1-2x/week  PT DURATION: 3-4 weeks  PLANNED INTERVENTIONS: 97164- PT Re-evaluation, 97110-Therapeutic exercises, 97530- Therapeutic activity, 97112- Neuromuscular re-education, 97535- Self Care, [id number]- Manual therapy, [id number]- Electrical stimulation (unattended), 97032- Electrical stimulation (manual), Patient/Family education, Taping, Dry Needling, Joint mobilization, Joint manipulation, Cryotherapy, and Moist heat  PLAN FOR NEXT SESSION: AAROM/AROM for R shoulder, manual techniques to improve ROM tolerance. Progressive strengthening of RTC, LHB, and shoulder complex stabilization drills.    Denese Finn, PT, DPT [phone] Physical Therapist - [facility]  02/27/2024,  4:05 PM
